# Patient Record
Sex: Female | Born: 1985
Health system: Southern US, Community
[De-identification: ages and names within clinical notes are randomized; demographics above are authoritative.]

## PROBLEM LIST (undated history)

## (undated) ENCOUNTER — Inpatient Hospital Stay (HOSPITAL_COMMUNITY): Payer: Self-pay

## (undated) DIAGNOSIS — F419 Anxiety disorder, unspecified: Secondary | ICD-10-CM

## (undated) DIAGNOSIS — J45909 Unspecified asthma, uncomplicated: Secondary | ICD-10-CM

## (undated) DIAGNOSIS — R002 Palpitations: Secondary | ICD-10-CM

## (undated) DIAGNOSIS — Z3189 Encounter for other procreative management: Secondary | ICD-10-CM

## (undated) DIAGNOSIS — D649 Anemia, unspecified: Secondary | ICD-10-CM

## (undated) HISTORY — PX: NO PAST SURGERIES: SHX2092

---

## 2011-05-15 ENCOUNTER — Emergency Department: Payer: Self-pay | Admitting: Unknown Physician Specialty

## 2011-05-15 LAB — CBC
HCT: 31.9 % — ABNORMAL LOW (ref 35.0–47.0)
HGB: 10 g/dL — ABNORMAL LOW (ref 12.0–16.0)
MCH: 21.1 pg — ABNORMAL LOW (ref 26.0–34.0)
MCHC: 31.3 g/dL — ABNORMAL LOW (ref 32.0–36.0)
MCV: 68 fL — ABNORMAL LOW (ref 80–100)
Platelet: 325 10*3/uL (ref 150–440)

## 2011-05-15 LAB — COMPREHENSIVE METABOLIC PANEL
Alkaline Phosphatase: 41 U/L — ABNORMAL LOW (ref 50–136)
Anion Gap: 12 (ref 7–16)
BUN: 10 mg/dL (ref 7–18)
Bilirubin,Total: 0.4 mg/dL (ref 0.2–1.0)
Calcium, Total: 9 mg/dL (ref 8.5–10.1)
Chloride: 108 mmol/L — ABNORMAL HIGH (ref 98–107)
Co2: 24 mmol/L (ref 21–32)
EGFR (African American): >60
EGFR (Non-African Amer.): >60
SGPT (ALT): 12 U/L
Total Protein: 7.7 g/dL (ref 6.4–8.2)

## 2011-05-15 LAB — PREGNANCY, URINE: Pregnancy Test, Urine: NEGATIVE m[IU]/mL

## 2011-05-15 LAB — URINALYSIS, COMPLETE
Bilirubin,UR: NEGATIVE
Blood: NEGATIVE
Glucose,UR: NEGATIVE mg/dL (ref 0–75)
Ketone: NEGATIVE
Specific Gravity: 1.002 (ref 1.003–1.030)
WBC UR: 1 /HPF (ref 0–5)

## 2011-07-16 ENCOUNTER — Emergency Department: Payer: Self-pay | Admitting: Emergency Medicine

## 2011-07-16 LAB — RAPID INFLUENZA A&B ANTIGENS

## 2011-07-17 ENCOUNTER — Emergency Department: Payer: Self-pay | Admitting: *Deleted

## 2011-07-19 ENCOUNTER — Emergency Department: Payer: Self-pay | Admitting: Emergency Medicine

## 2012-01-21 ENCOUNTER — Emergency Department: Payer: Self-pay | Admitting: Emergency Medicine

## 2012-01-21 LAB — BASIC METABOLIC PANEL
BUN: 10 mg/dL (ref 7–18)
Calcium, Total: 8.7 mg/dL (ref 8.5–10.1)
Chloride: 106 mmol/L (ref 98–107)
Creatinine: 0.86 mg/dL (ref 0.60–1.30)
EGFR (Non-African Amer.): >60
Glucose: 63 mg/dL — ABNORMAL LOW (ref 65–99)
Osmolality: 278 (ref 275–301)
Potassium: 3.5 mmol/L (ref 3.5–5.1)

## 2012-01-21 LAB — CBC
HCT: 35 % (ref 35.0–47.0)
HGB: 11.1 g/dL — ABNORMAL LOW (ref 12.0–16.0)
MCH: 23 pg — ABNORMAL LOW (ref 26.0–34.0)
MCHC: 31.8 g/dL — ABNORMAL LOW (ref 32.0–36.0)
MCV: 73 fL — ABNORMAL LOW (ref 80–100)
Platelet: 299 10*3/uL (ref 150–440)
RBC: 4.82 10*6/uL (ref 3.80–5.20)
RDW: 20.5 % — ABNORMAL HIGH (ref 11.5–14.5)
WBC: 9.5 10*3/uL (ref 3.6–11.0)

## 2012-01-21 LAB — MAGNESIUM: Magnesium: 1.7 mg/dL — ABNORMAL LOW

## 2012-03-10 ENCOUNTER — Emergency Department: Payer: Self-pay

## 2012-08-24 ENCOUNTER — Emergency Department: Payer: Self-pay | Admitting: Emergency Medicine

## 2012-09-28 ENCOUNTER — Emergency Department: Payer: Self-pay | Admitting: Emergency Medicine

## 2012-09-28 LAB — CBC
HCT: 27.4 % — ABNORMAL LOW (ref 35.0–47.0)
HGB: 8.6 g/dL — ABNORMAL LOW (ref 12.0–16.0)
MCH: 20.2 pg — ABNORMAL LOW (ref 26.0–34.0)
MCHC: 31.4 g/dL — ABNORMAL LOW (ref 32.0–36.0)
MCV: 64 fL — ABNORMAL LOW (ref 80–100)

## 2012-09-28 LAB — URINALYSIS, COMPLETE
Bacteria: NONE SEEN
Bilirubin,UR: NEGATIVE
Glucose,UR: NEGATIVE mg/dL (ref 0–75)
Ketone: NEGATIVE
Leukocyte Esterase: NEGATIVE
Ph: 5 (ref 4.5–8.0)
Protein: NEGATIVE
RBC,UR: <1 /HPF (ref 0–5)
Specific Gravity: 1.032 (ref 1.003–1.030)
WBC UR: <1 /HPF (ref 0–5)

## 2012-09-28 LAB — BASIC METABOLIC PANEL
Anion Gap: 4 — ABNORMAL LOW (ref 7–16)
BUN: 11 mg/dL (ref 7–18)
Calcium, Total: 8.9 mg/dL (ref 8.5–10.1)
Chloride: 107 mmol/L (ref 98–107)
Co2: 27 mmol/L (ref 21–32)
EGFR (African American): >60
Glucose: 73 mg/dL (ref 65–99)
Potassium: 3.6 mmol/L (ref 3.5–5.1)

## 2012-09-28 LAB — TROPONIN I: Troponin-I: <0.02 ng/mL

## 2012-11-02 ENCOUNTER — Emergency Department: Payer: Self-pay | Admitting: Emergency Medicine

## 2012-11-02 LAB — COMPREHENSIVE METABOLIC PANEL
Alkaline Phosphatase: 67 U/L (ref 50–136)
Anion Gap: 6 — ABNORMAL LOW (ref 7–16)
BUN: 7 mg/dL (ref 7–18)
Chloride: 108 mmol/L — ABNORMAL HIGH (ref 98–107)
Co2: 27 mmol/L (ref 21–32)
Creatinine: 0.81 mg/dL (ref 0.60–1.30)
EGFR (Non-African Amer.): >60
Glucose: 90 mg/dL (ref 65–99)
Potassium: 3.4 mmol/L — ABNORMAL LOW (ref 3.5–5.1)
SGOT(AST): 20 U/L (ref 15–37)
SGPT (ALT): 13 U/L (ref 12–78)
Total Protein: 7.9 g/dL (ref 6.4–8.2)

## 2012-11-02 LAB — CBC
HCT: 29.8 % — ABNORMAL LOW (ref 35.0–47.0)
HGB: 9.4 g/dL — ABNORMAL LOW (ref 12.0–16.0)
MCV: 64 fL — ABNORMAL LOW (ref 80–100)
Platelet: 348 10*3/uL (ref 150–440)
RBC: 4.64 10*6/uL (ref 3.80–5.20)
WBC: 9 10*3/uL (ref 3.6–11.0)

## 2012-11-02 LAB — URINALYSIS, COMPLETE
Bilirubin,UR: NEGATIVE
Hyaline Cast: 3
Leukocyte Esterase: NEGATIVE
Ph: 5 (ref 4.5–8.0)
RBC,UR: 121 /HPF (ref 0–5)
Specific Gravity: 1.028 (ref 1.003–1.030)
WBC UR: 1 /HPF (ref 0–5)

## 2012-11-02 LAB — PREGNANCY, URINE: Pregnancy Test, Urine: NEGATIVE m[IU]/mL

## 2013-01-04 ENCOUNTER — Emergency Department: Payer: Self-pay | Admitting: Emergency Medicine

## 2013-01-04 LAB — RAPID INFLUENZA A&B ANTIGENS

## 2013-03-19 ENCOUNTER — Emergency Department: Payer: Self-pay | Admitting: Emergency Medicine

## 2013-04-20 ENCOUNTER — Emergency Department: Payer: Self-pay | Admitting: Emergency Medicine

## 2013-08-14 ENCOUNTER — Emergency Department: Payer: Self-pay | Admitting: Emergency Medicine

## 2014-02-20 ENCOUNTER — Emergency Department: Payer: Self-pay | Admitting: Emergency Medicine

## 2014-04-26 ENCOUNTER — Emergency Department: Payer: Self-pay | Admitting: Emergency Medicine

## 2014-04-26 LAB — URINALYSIS, COMPLETE
Bacteria: NONE SEEN
Bilirubin,UR: NEGATIVE
Glucose,UR: NEGATIVE mg/dL (ref 0–75)
Ketone: NEGATIVE
Leukocyte Esterase: NEGATIVE
Nitrite: NEGATIVE
PH: 7 (ref 4.5–8.0)
PROTEIN: NEGATIVE
SPECIFIC GRAVITY: 1.002 (ref 1.003–1.030)
Squamous Epithelial: 1
WBC UR: 1 /HPF (ref 0–5)

## 2014-04-26 LAB — COMPREHENSIVE METABOLIC PANEL
ALBUMIN: 3.8 g/dL (ref 3.4–5.0)
AST: 14 U/L — AB (ref 15–37)
Alkaline Phosphatase: 57 U/L
Anion Gap: 6 — ABNORMAL LOW (ref 7–16)
BUN: 7 mg/dL (ref 7–18)
Bilirubin,Total: 0.2 mg/dL (ref 0.2–1.0)
Calcium, Total: 8.5 mg/dL (ref 8.5–10.1)
Chloride: 106 mmol/L (ref 98–107)
Co2: 27 mmol/L (ref 21–32)
Creatinine: 0.9 mg/dL (ref 0.60–1.30)
EGFR (African American): >60
Glucose: 119 mg/dL — ABNORMAL HIGH (ref 65–99)
OSMOLALITY: 277 (ref 275–301)
POTASSIUM: 3.4 mmol/L — AB (ref 3.5–5.1)
SGPT (ALT): 14 U/L
SODIUM: 139 mmol/L (ref 136–145)
TOTAL PROTEIN: 8.1 g/dL (ref 6.4–8.2)

## 2014-04-26 LAB — CBC WITH DIFFERENTIAL/PLATELET
Basophil #: 0.2 10*3/uL — ABNORMAL HIGH (ref 0.0–0.1)
Basophil %: 1.9 %
Eosinophil #: 0.3 10*3/uL (ref 0.0–0.7)
Eosinophil %: 3.2 %
HCT: 31.1 % — ABNORMAL LOW (ref 35.0–47.0)
HGB: 9.7 g/dL — AB (ref 12.0–16.0)
Lymphocyte #: 2.8 10*3/uL (ref 1.0–3.6)
Lymphocyte %: 29.8 %
MCH: 21.4 pg — ABNORMAL LOW (ref 26.0–34.0)
MCHC: 31.1 g/dL — AB (ref 32.0–36.0)
MCV: 69 fL — AB (ref 80–100)
MONOS PCT: 5.9 %
Monocyte #: 0.5 x10 3/mm (ref 0.2–0.9)
Neutrophil #: 5.5 10*3/uL (ref 1.4–6.5)
Neutrophil %: 59.2 %
Platelet: 412 10*3/uL (ref 150–440)
RBC: 4.51 10*6/uL (ref 3.80–5.20)
RDW: 21.3 % — ABNORMAL HIGH (ref 11.5–14.5)
WBC: 9.3 10*3/uL (ref 3.6–11.0)

## 2014-05-07 ENCOUNTER — Emergency Department: Payer: Self-pay | Admitting: Emergency Medicine

## 2014-05-07 LAB — COMPREHENSIVE METABOLIC PANEL
ANION GAP: 7 (ref 7–16)
Albumin: 3.7 g/dL (ref 3.4–5.0)
Alkaline Phosphatase: 62 U/L
BUN: 6 mg/dL — AB (ref 7–18)
Bilirubin,Total: 0.2 mg/dL (ref 0.2–1.0)
CHLORIDE: 106 mmol/L (ref 98–107)
Calcium, Total: 8.8 mg/dL (ref 8.5–10.1)
Co2: 27 mmol/L (ref 21–32)
Creatinine: 0.88 mg/dL (ref 0.60–1.30)
EGFR (Non-African Amer.): >60
GLUCOSE: 83 mg/dL (ref 65–99)
OSMOLALITY: 276 (ref 275–301)
Potassium: 3.6 mmol/L (ref 3.5–5.1)
SGOT(AST): 21 U/L (ref 15–37)
SGPT (ALT): 18 U/L
SODIUM: 140 mmol/L (ref 136–145)
TOTAL PROTEIN: 8.1 g/dL (ref 6.4–8.2)

## 2014-05-07 LAB — CBC WITH DIFFERENTIAL/PLATELET
Basophil #: 0.3 10*3/uL — ABNORMAL HIGH (ref 0.0–0.1)
Basophil %: 2.7 %
EOS PCT: 1.8 %
Eosinophil #: 0.2 10*3/uL (ref 0.0–0.7)
HCT: 32.5 % — ABNORMAL LOW (ref 35.0–47.0)
HGB: 10.2 g/dL — AB (ref 12.0–16.0)
LYMPHS ABS: 2.9 10*3/uL (ref 1.0–3.6)
Lymphocyte %: 27.9 %
MCH: 21.5 pg — ABNORMAL LOW (ref 26.0–34.0)
MCHC: 31.4 g/dL — ABNORMAL LOW (ref 32.0–36.0)
MCV: 68 fL — ABNORMAL LOW (ref 80–100)
Monocyte #: 0.6 x10 3/mm (ref 0.2–0.9)
Monocyte %: 6.1 %
Neutrophil #: 6.4 10*3/uL (ref 1.4–6.5)
Neutrophil %: 61.5 %
Platelet: 403 10*3/uL (ref 150–440)
RBC: 4.75 10*6/uL (ref 3.80–5.20)
RDW: 21 % — ABNORMAL HIGH (ref 11.5–14.5)
WBC: 10.5 10*3/uL (ref 3.6–11.0)

## 2014-05-07 LAB — TROPONIN I: Troponin-I: <0.02 ng/mL

## 2014-05-07 LAB — LIPASE, BLOOD: Lipase: 102 U/L (ref 73–393)

## 2014-05-08 LAB — URINALYSIS, COMPLETE
BACTERIA: NONE SEEN
Bilirubin,UR: NEGATIVE
Blood: NEGATIVE
GLUCOSE, UR: NEGATIVE mg/dL (ref 0–75)
Ketone: NEGATIVE
LEUKOCYTE ESTERASE: NEGATIVE
Nitrite: NEGATIVE
Ph: 6 (ref 4.5–8.0)
Protein: NEGATIVE
RBC,UR: <1 /HPF (ref 0–5)
Specific Gravity: 1.01 (ref 1.003–1.030)

## 2014-08-04 ENCOUNTER — Emergency Department
Admit: 2014-08-04 | Disposition: A | Discharge status: Home / Self Care | Payer: Self-pay | Admitting: Emergency Medicine

## 2015-02-18 IMAGING — CR DG CHEST 2V
1 series · 2 of 2 positions shown · non-contrast
Comparison: none

REASON FOR EXAM: Chest Pain
COMMENTS:

[Series 1: pa · 0.17mm/px · 2 of 2 slices shown]
[im 1/2]
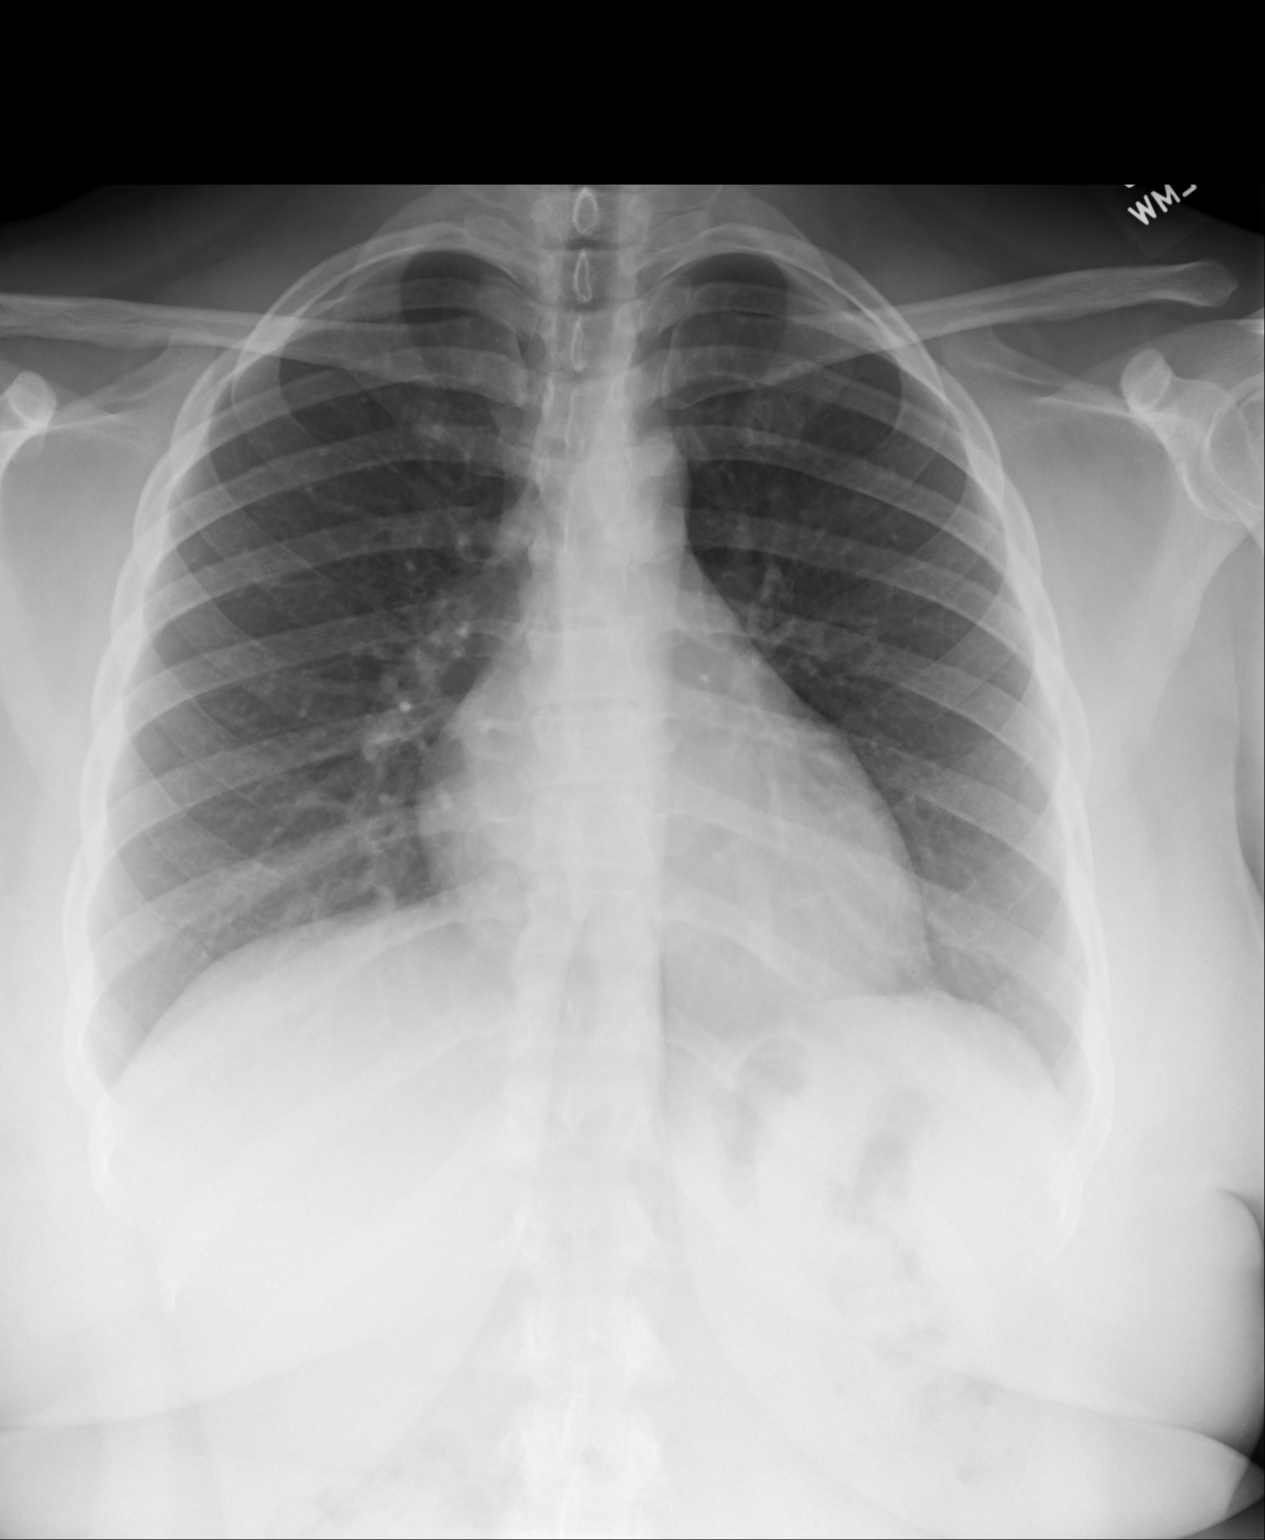
[im 2/2]
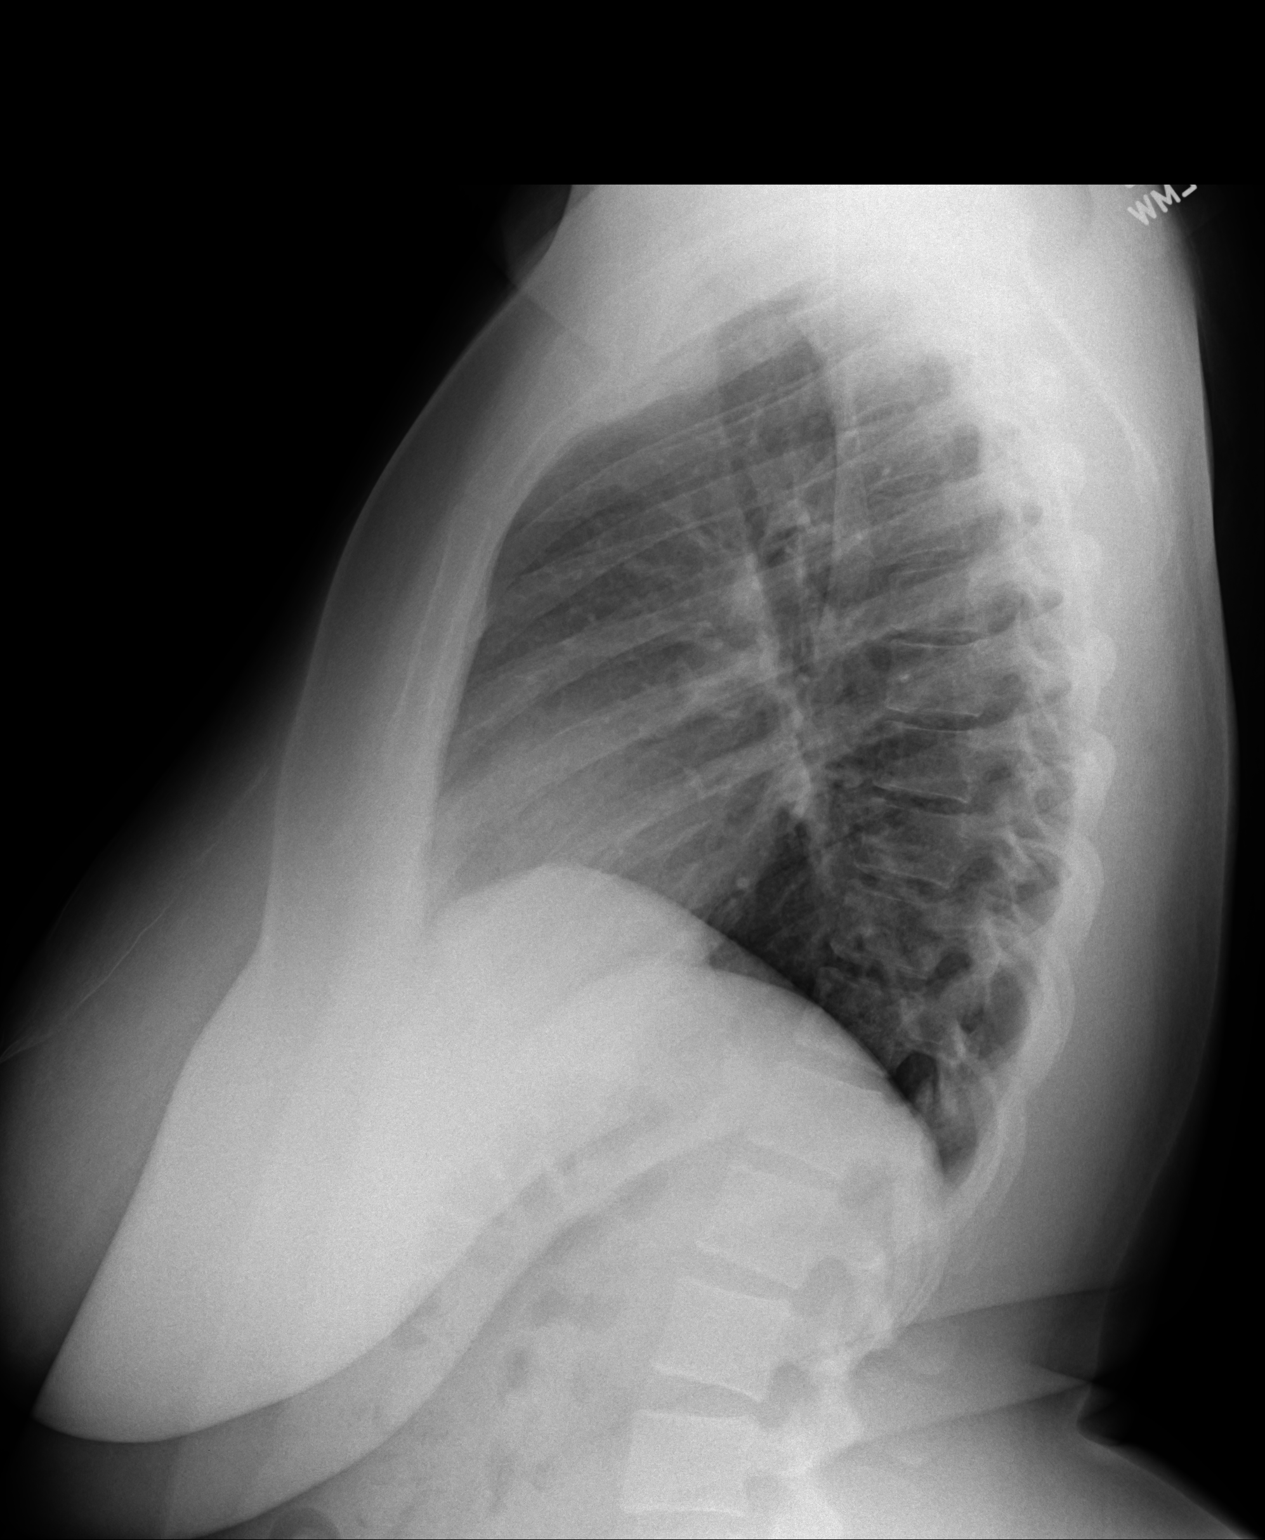

[2 of 2 positions shown; findings below may reference images not displayed]

PROCEDURE:     DXR - DXR CHEST PA (OR AP) AND LATERAL  - September 28, 2012  [DATE]

RESULT:     Comparison is made to the study January 21, 2012.

The lungs are adequately inflated. There is no focal infiltrate. The cardiac
silhouette is normal in size. The mediastinum is normal in width. There is
no pleural effusion or pneumothorax. The bony thorax exhibits no acute
abnormality.
IMPRESSION: There is no evidence of acute cardiopulmonary abnormality.

[REDACTED]

## 2015-07-04 ENCOUNTER — Encounter (HOSPITAL_COMMUNITY): Payer: Self-pay | Admitting: Emergency Medicine

## 2015-07-04 ENCOUNTER — Emergency Department (INDEPENDENT_AMBULATORY_CARE_PROVIDER_SITE_OTHER)
Admission: EM | Admit: 2015-07-04 | Discharge: 2015-07-04 | Disposition: A | Discharge status: Home / Self Care | Payer: BLUE CROSS/BLUE SHIELD | Source: Home / Self Care | Attending: Emergency Medicine | Admitting: Emergency Medicine

## 2015-07-04 DIAGNOSIS — D500 Iron deficiency anemia secondary to blood loss (chronic): Secondary | ICD-10-CM

## 2015-07-04 DIAGNOSIS — N921 Excessive and frequent menstruation with irregular cycle: Secondary | ICD-10-CM

## 2015-07-04 HISTORY — DX: Unspecified asthma, uncomplicated: J45.909

## 2015-07-04 LAB — POCT I-STAT, CHEM 8
BUN: 8 mg/dL (ref 6–20)
CREATININE: 0.8 mg/dL (ref 0.44–1.00)
Calcium, Ion: 1.23 mmol/L (ref 1.12–1.23)
Chloride: 104 mmol/L (ref 101–111)
Glucose, Bld: 91 mg/dL (ref 65–99)
HEMATOCRIT: 30 % — AB (ref 36.0–46.0)
Hemoglobin: 10.2 g/dL — ABNORMAL LOW (ref 12.0–15.0)
POTASSIUM: 3.6 mmol/L (ref 3.5–5.1)
Sodium: 140 mmol/L (ref 135–145)
TCO2: 23 mmol/L (ref 0–100)

## 2015-07-04 MED ORDER — MEDROXYPROGESTERONE ACETATE 10 MG PO TABS: 10.0000 mg | ORAL_TABLET | Freq: Every day | ORAL | Status: DC

## 2015-07-04 MED ORDER — ALBUTEROL SULFATE HFA 108 (90 BASE) MCG/ACT IN AERS: 1.0000 | INHALATION_SPRAY | Freq: Four times a day (QID) | RESPIRATORY_TRACT | Status: DC | PRN

## 2015-07-04 NOTE — Discharge Instructions (Signed)
Your shortness of breath and muscle weakness is coming from anemia. This is from your prolonged heavy period. Take Provera daily for the next 14 days. This will stop the bleeding. When you stop the medicine, you should have a normal period. Please get an iron pill over-the-counter. The most common one is ferrous sulfate 325 mg with 65 mg of elemental iron.  Take this 3 times a day with meals. This may cause upset stomach and constipation. You should see improvement in the weakness and shortness of breath over the next week. If the bleeding does not resolve or your symptoms are getting worse, please [facility] to [facility].

## 2015-07-04 NOTE — ED Notes (Signed)
[facility] with a complaint of wheezing and shortness of breath x 2 days.

## 2015-07-04 NOTE — ED Provider Notes (Signed)
CSN: [id number]     Arrival date & time 07/04/15  1855 History   First MD Initiated Contact with Patient 07/04/15 1957     Chief Complaint  Patient presents with  . Wheezing  . Shortness of Breath   (Consider location/radiation/quality/duration/timing/severity/associated sxs/prior Treatment) HPI She is a 30 year old woman here for evaluation of shortness of breath. She states over the last 2 days or so time she gets up she'll get short of breath and dizzy. She states she has been having heavy menstrual bleeding with clots for the last 4 months. She reports shortness of breath and muscle weakness with exertion. She's been having slight dizziness for a week or so, but it has gotten worse in the last several days. She is also heard some wheezing, but does have a history of asthma. She does not currently have any albuterol at home. No fevers. No nasal congestion. No cough.  She states it is not uncommon for her to have several months of bleeding, but this is a little excessive both in duration and amount.  Past Medical History  Diagnosis Date  . Asthma    History reviewed. No pertinent past surgical history. History reviewed. No pertinent family history. Social History  Substance Use Topics  . Smoking status: Never Smoker   . Smokeless tobacco: None  . Alcohol Use: No   OB History    No data available     Review of Systems As in history of present illness Allergies  Bactrim  Home Medications   Prior to Admission medications   Medication Sig Start Date End Date Taking? Authorizing Provider  albuterol (PROVENTIL HFA;VENTOLIN HFA) 108 (90 Base) MCG/ACT inhaler Inhale 1 puff into the lungs every 6 (six) hours as needed for wheezing or shortness of breath. 07/04/15   Charm RingsErin J Chaye Misch, MD  medroxyPROGESTERone (PROVERA) 10 MG tablet Take 1 tablet (10 mg total) by mouth daily. 07/04/15   Charm RingsErin J Bohdi Leeds, MD   Meds Ordered and Administered this Visit  Medications - No data to display  BP 122/74  mmHg  Pulse 85  Temp(Src) 97.8 F (36.6 C) (Oral)  Resp 16  SpO2 100%  LMP 07/04/2015 (Exact Date) Orthostatic VS for the past 24 hrs:  BP- Lying Pulse- Lying BP- Sitting Pulse- Sitting BP- Standing at 0 minutes Pulse- Standing at 0 minutes  07/04/15 2023 125/70 mmHg 83 119/71 mmHg 93 134/78 mmHg 119    Physical Exam  Constitutional: She is oriented to person, place, and time. She appears well-developed and well-nourished. No distress.  Neck: Neck supple.  Cardiovascular: Normal rate, regular rhythm and normal heart sounds.   No murmur heard. Pulmonary/Chest: Effort normal and breath sounds normal. No respiratory distress. She has no wheezes. She has no rales.  Neurological: She is alert and oriented to person, place, and time.    ED Course  Procedures (including critical care time)  Labs Review Labs Reviewed  POCT I-STAT, CHEM 8 - Abnormal; Notable for the following:    Hemoglobin 10.2 (*)    HCT 30.0 (*)    All other components within normal limits    Imaging Review No results found.    MDM   1. Iron deficiency anemia due to chronic blood loss   2. Menorrhagia with irregular cycle    Hemoglobin is slightly low at 10.2. Orthostatics minimally positive with increase in pulse rate. Suspect this is due to blood loss anemia from menorrhagia. Will treat with Provera to try and stop the bleeding. Recommended  OTC iron pills 3 times a day. I did provide a prescription for her albuterol. Return precautions reviewed.    Charm Rings, MD 07/04/15 2107

## 2015-09-09 ENCOUNTER — Encounter (HOSPITAL_COMMUNITY): Payer: Self-pay | Admitting: *Deleted

## 2015-09-09 ENCOUNTER — Emergency Department (HOSPITAL_COMMUNITY)
Admission: EM | Admit: 2015-09-09 | Discharge: 2015-09-09 | Disposition: A | Discharge status: Home / Self Care | Payer: BLUE CROSS/BLUE SHIELD | Attending: Emergency Medicine | Admitting: Emergency Medicine

## 2015-09-09 DIAGNOSIS — J45909 Unspecified asthma, uncomplicated: Secondary | ICD-10-CM | POA: Diagnosis not present

## 2015-09-09 DIAGNOSIS — Z79899 Other long term (current) drug therapy: Secondary | ICD-10-CM | POA: Insufficient documentation

## 2015-09-09 DIAGNOSIS — N939 Abnormal uterine and vaginal bleeding, unspecified: Secondary | ICD-10-CM | POA: Diagnosis present

## 2015-09-09 DIAGNOSIS — Z3202 Encounter for pregnancy test, result negative: Secondary | ICD-10-CM | POA: Diagnosis not present

## 2015-09-09 DIAGNOSIS — N72 Inflammatory disease of cervix uteri: Secondary | ICD-10-CM | POA: Diagnosis not present

## 2015-09-09 LAB — CBC WITH DIFFERENTIAL/PLATELET
BASOS ABS: 0.2 10*3/uL — AB (ref 0.0–0.1)
Basophils Relative: 1 %
EOS ABS: 0.3 10*3/uL (ref 0.0–0.7)
Eosinophils Relative: 2 %
HCT: 38 % (ref 36.0–46.0)
Hemoglobin: 12.2 g/dL (ref 12.0–15.0)
LYMPHS ABS: 2.9 10*3/uL (ref 0.7–4.0)
Lymphocytes Relative: 19 %
MCH: 22.7 pg — ABNORMAL LOW (ref 26.0–34.0)
MCHC: 32.1 g/dL (ref 30.0–36.0)
MCV: 70.8 fL — ABNORMAL LOW (ref 78.0–100.0)
MONO ABS: 0.9 10*3/uL (ref 0.1–1.0)
Monocytes Relative: 6 %
Neutro Abs: 10.7 10*3/uL — ABNORMAL HIGH (ref 1.7–7.7)
Neutrophils Relative %: 72 %
PLATELETS: 378 10*3/uL (ref 150–400)
RBC: 5.37 MIL/uL — AB (ref 3.87–5.11)
RDW: 21.2 % — AB (ref 11.5–15.5)
Smear Review: ADEQUATE
WBC: 15 10*3/uL — AB (ref 4.0–10.5)

## 2015-09-09 LAB — WET PREP, GENITAL
CLUE CELLS WET PREP: NONE SEEN
Sperm: NONE SEEN
Trich, Wet Prep: NONE SEEN
Yeast Wet Prep HPF POC: NONE SEEN

## 2015-09-09 LAB — BASIC METABOLIC PANEL
ANION GAP: 10 (ref 5–15)
BUN: <5 mg/dL — ABNORMAL LOW (ref 6–20)
CALCIUM: 9.3 mg/dL (ref 8.9–10.3)
CO2: 21 mmol/L — ABNORMAL LOW (ref 22–32)
Chloride: 106 mmol/L (ref 101–111)
Creatinine, Ser: 0.8 mg/dL (ref 0.44–1.00)
GLUCOSE: 104 mg/dL — AB (ref 65–99)
Potassium: 4 mmol/L (ref 3.5–5.1)
SODIUM: 137 mmol/L (ref 135–145)

## 2015-09-09 LAB — POC URINE PREG, ED: Preg Test, Ur: NEGATIVE

## 2015-09-09 LAB — HCG, QUANTITATIVE, PREGNANCY: HCG, BETA CHAIN, QUANT, S: 1 m[IU]/mL (ref <5)

## 2015-09-09 MED ORDER — AZITHROMYCIN 250 MG PO TABS
1000.0000 mg | ORAL_TABLET | Freq: Once | ORAL | Status: AC
  Administered 2015-09-09: 1000 mg via ORAL
  Filled 2015-09-09: qty 4

## 2015-09-09 MED ORDER — CEFTRIAXONE SODIUM 250 MG IJ SOLR
250.0000 mg | Freq: Once | INTRAMUSCULAR | Status: AC
  Administered 2015-09-09: 250 mg via INTRAMUSCULAR
  Filled 2015-09-09: qty 250

## 2015-09-09 MED ORDER — DOXYCYCLINE HYCLATE 100 MG PO CAPS: 100.0000 mg | ORAL_CAPSULE | Freq: Two times a day (BID) | ORAL | Status: DC

## 2015-09-09 NOTE — ED Provider Notes (Signed)
CSN: [id number]     Arrival date & time 09/09/15  0228 History   First MD Initiated Contact with Patient 09/09/15 0405     Chief Complaint  Patient presents with  . Anemia     (Consider location/radiation/quality/duration/timing/severity/associated sxs/prior Treatment) Patient is a 30 y.o. female presenting with vaginal bleeding. The history is provided by the patient.  Vaginal Bleeding Quality:  Spotting Severity:  Moderate Onset quality:  Gradual Duration:  2 weeks Timing:  Constant Progression:  Unchanged Chronicity:  New Menstrual history:  Irregular Possible pregnancy: no   Relieved by:  Nothing Worsened by:  Nothing tried Ineffective treatments:  None tried Associated symptoms: vaginal discharge (scant)   Associated symptoms: no abdominal pain, no back pain, no dysuria, no fever and no nausea   Risk factors: no new sexual partner     Past Medical History  Diagnosis Date  . Asthma    History reviewed. No pertinent past surgical history. No family history on file. Social History  Substance Use Topics  . Smoking status: Never Smoker   . Smokeless tobacco: None  . Alcohol Use: No   OB History    No data available     Review of Systems  Constitutional: Negative for fever.  Gastrointestinal: Negative for nausea and abdominal pain.  Genitourinary: Positive for vaginal bleeding and vaginal discharge (scant). Negative for dysuria.  Musculoskeletal: Negative for back pain.  All other systems reviewed and are negative.     Allergies  Bactrim  Home Medications   Prior to Admission medications   Medication Sig Start Date End Date Taking? Authorizing Provider  albuterol (PROVENTIL HFA;VENTOLIN HFA) 108 (90 Base) MCG/ACT inhaler Inhale 1 puff into the lungs every 6 (six) hours as needed for wheezing or shortness of breath. 07/04/15  Yes Charm RingsErin J Honig, MD  ferrous sulfate 325 (65 FE) MG tablet Take 325 mg by mouth 3 (three) times daily with meals.   Yes Historical  Provider, MD  norethindrone (AYGESTIN) 5 MG tablet Take 5 mg by mouth 3 (three) times daily.   Yes Historical Provider, MD  doxycycline (VIBRAMYCIN) 100 MG capsule Take 1 capsule (100 mg total) by mouth 2 (two) times daily. 09/09/15   Lyndal Pulleyaniel Harley Mccartney, MD   BP 129/85 mmHg  Pulse 81  Temp(Src) 98.5 F (36.9 C) (Oral)  Resp 17  Ht 5\' 3"  (1.6 m)  Wt 193 lb (87.544 kg)  BMI 34.20 kg/m2  SpO2 100%  LMP 09/09/2015 Physical Exam  Constitutional: She is oriented to person, place, and time. She appears well-developed and well-nourished. No distress.  HENT:  Head: Normocephalic.  Eyes: Conjunctivae are normal.  Neck: Neck supple. No tracheal deviation present.  Cardiovascular: Normal rate, regular rhythm and normal heart sounds.   Pulmonary/Chest: Effort normal and breath sounds normal. No respiratory distress.  Abdominal: Soft. She exhibits no distension. There is no tenderness. There is no rebound.  Genitourinary: Uterus normal. Cervix exhibits motion tenderness. Cervix exhibits no friability. Right adnexum displays no tenderness. Left adnexum displays no tenderness. No bleeding in the vagina. Vaginal discharge (yellow, foul odor) found.  Neurological: She is alert and oriented to person, place, and time.  Skin: Skin is warm and dry.  Psychiatric: She has a normal mood and affect.    ED Course  Procedures (including critical care time) Labs Review Labs Reviewed  WET PREP, GENITAL - Abnormal; Notable for the following:    WBC, Wet Prep HPF POC TOO NUMEROUS TO COUNT (*)    All other  components within normal limits  CBC WITH DIFFERENTIAL/PLATELET - Abnormal; Notable for the following:    WBC 15.0 (*)    RBC 5.37 (*)    MCV 70.8 (*)    MCH 22.7 (*)    RDW 21.2 (*)    Neutro Abs 10.7 (*)    Basophils Absolute 0.2 (*)    All other components within normal limits  BASIC METABOLIC PANEL - Abnormal; Notable for the following:    CO2 21 (*)    Glucose, Bld 104 (*)    BUN <5 (*)    All other  components within normal limits  HCG, QUANTITATIVE, PREGNANCY  POC URINE PREG, ED  SAMPLE TO BLOOD BANK  GC/CHLAMYDIA PROBE AMP ([place]) NOT AT [facility]    Imaging Review No results found. I have personally reviewed and evaluated these images and lab results as part of my medical decision-making.   EKG Interpretation None      MDM   Final diagnoses:  Cervicitis    30 y.o. female presents with vaginal discharge, spotting, and feeling short of breath this morning. Not anemic. Notable nonspecific leukocytosis. No active bleeding but discharge concerning for STI so covered empirically for PID pending swabs. Otherwise well appearing. Routine gyn follow up recommended and return precautions discussed.     Lyndal Pulley, MD 09/09/15 1102

## 2015-09-09 NOTE — ED Notes (Signed)
MD at bedside. 

## 2015-09-09 NOTE — Discharge Instructions (Signed)
Cervicitis °Cervicitis is a soreness and swelling (inflammation) of the cervix. Your cervix is located at the bottom of your uterus. It opens up to the vagina. °CAUSES  °· Sexually transmitted infections (STIs).   °· Allergic reaction.   °· Medicines or birth control devices that are put in the vagina.   °· Injury to the cervix.   °· Bacterial infections.   °RISK FACTORS °You are at greater risk if you: °· Have unprotected sexual intercourse. °· Have sexual intercourse with many partners. °· Began sexual intercourse at an early age. °· Have a history of STIs. °SYMPTOMS  °There may be no symptoms. If symptoms occur, they may include:  °· Gray, white, yellow, or bad-smelling vaginal discharge.   °· Pain or itching of the area outside the vagina.   °· Painful sexual intercourse.   °· Lower abdominal or lower back pain, especially during intercourse.   °· Frequent urination.   °· Abnormal vaginal bleeding between periods, after sexual intercourse, or after menopause.   °· Pressure or a heavy feeling in the pelvis.   °DIAGNOSIS  °Diagnosis is made after a pelvic exam. Other tests may include:  °· Examination of any discharge under a microscope (wet prep).   °· A Pap test.   °TREATMENT  °Treatment will depend on the cause of cervicitis. If it is caused by an STI, both you and your partner will need to be treated. Antibiotic medicines will be given.  °HOME CARE INSTRUCTIONS  °· Do not have sexual intercourse until your health care provider says it is okay.   °· Do not have sexual intercourse until your partner has been treated, if your cervicitis is caused by an STI.   °· Take your antibiotics as directed. Finish them even if you start to feel better.   °SEEK MEDICAL CARE IF: °· Your symptoms come back.   °· You have a fever.   °MAKE SURE YOU:  °· Understand these instructions. °· Will watch your condition. °· Will get help right away if you are not doing well or get worse. °  °This information is not intended to replace  advice given to you by your health care provider. Make sure you discuss any questions you have with your health care provider. °  °Document Released: 04/14/2005 Document Revised: 04/19/2013 Document Reviewed: 10/06/2012 °Elsevier Interactive Patient Education ©2016 [employer] ° °

## 2015-09-09 NOTE — ED Notes (Signed)
The pt woke up this am feeling sob.  She has been having prolonged periods  She has seen her ob-gyn for the same and she has bee spotting3 weeks  She has low hgb   After her period lasted 3 months just prior to this 3 week period

## 2015-09-10 ENCOUNTER — Telehealth (HOSPITAL_BASED_OUTPATIENT_CLINIC_OR_DEPARTMENT_OTHER): Payer: Self-pay | Admitting: Emergency Medicine

## 2015-09-10 ENCOUNTER — Other Ambulatory Visit: Payer: Self-pay | Admitting: Obstetrics and Gynecology

## 2015-09-10 LAB — SAMPLE TO BLOOD BANK

## 2015-09-10 LAB — GC/CHLAMYDIA PROBE AMP (~~LOC~~) NOT AT ARMC
CHLAMYDIA, DNA PROBE: NEGATIVE
Neisseria Gonorrhea: NEGATIVE

## 2015-09-23 ENCOUNTER — Emergency Department (HOSPITAL_COMMUNITY)
Admission: EM | Admit: 2015-09-23 | Discharge: 2015-09-23 | Disposition: A | Discharge status: Home / Self Care | Payer: BLUE CROSS/BLUE SHIELD | Attending: Emergency Medicine | Admitting: Emergency Medicine

## 2015-09-23 ENCOUNTER — Encounter (HOSPITAL_COMMUNITY): Payer: Self-pay | Admitting: Emergency Medicine

## 2015-09-23 DIAGNOSIS — Z792 Long term (current) use of antibiotics: Secondary | ICD-10-CM | POA: Insufficient documentation

## 2015-09-23 DIAGNOSIS — Z79899 Other long term (current) drug therapy: Secondary | ICD-10-CM | POA: Insufficient documentation

## 2015-09-23 DIAGNOSIS — J45909 Unspecified asthma, uncomplicated: Secondary | ICD-10-CM | POA: Insufficient documentation

## 2015-09-23 DIAGNOSIS — N939 Abnormal uterine and vaginal bleeding, unspecified: Secondary | ICD-10-CM | POA: Insufficient documentation

## 2015-09-23 LAB — URINE MICROSCOPIC-ADD ON: Bacteria, UA: NONE SEEN

## 2015-09-23 LAB — URINALYSIS, ROUTINE W REFLEX MICROSCOPIC
Bilirubin Urine: NEGATIVE
Glucose, UA: NEGATIVE mg/dL
KETONES UR: NEGATIVE mg/dL
NITRITE: NEGATIVE
PH: 6.5 (ref 5.0–8.0)
Protein, ur: NEGATIVE mg/dL
SPECIFIC GRAVITY, URINE: 1.007 (ref 1.005–1.030)

## 2015-09-23 LAB — CBC
HEMATOCRIT: 36.9 % (ref 36.0–46.0)
HEMOGLOBIN: 11.3 g/dL — AB (ref 12.0–15.0)
MCH: 21.9 pg — ABNORMAL LOW (ref 26.0–34.0)
MCHC: 30.6 g/dL (ref 30.0–36.0)
MCV: 71.5 fL — ABNORMAL LOW (ref 78.0–100.0)
Platelets: 426 10*3/uL — ABNORMAL HIGH (ref 150–400)
RBC: 5.16 MIL/uL — ABNORMAL HIGH (ref 3.87–5.11)
RDW: 20.9 % — ABNORMAL HIGH (ref 11.5–15.5)
WBC: 12.9 10*3/uL — AB (ref 4.0–10.5)

## 2015-09-23 NOTE — ED Notes (Signed)
Pt arrives by POV with c/o vaginal bleeding since Wednesday, but got extremely heavy in the last couple of hours. Pt was on Norethisterone for 2 months to stop bleeding that had been going on for 3 months. Pt was advised that there would be withdrawal bleeding, however, pt feels that she is bleeding excessively and is passing large clots. Pt has saturated 2 pads in 30 minutes and is currently wearing her third pad in the last half hour.

## 2015-09-23 NOTE — ED Provider Notes (Signed)
CSN: [id number]     Arrival date & time 09/23/15  0554 History   First MD Initiated Contact with Patient 09/23/15 [phone]     Chief Complaint  Patient presents with  . Vaginal Bleeding   (Consider location/radiation/quality/duration/timing/severity/associated sxs/prior Treatment) HPI 30 y.o. female, presents to the Emergency Department today complaining of diffuse vaginal bleeding after coming off of Norethisterone for 2 months. States that she was on the progesterone therapy due to profuse vaginal bleeding x 3 months. GYN has seen and evaluated since then. Last office visit was on Tuesday with Hgb 12. Pt DCed progesterone therapy this past Wednesday. Pt states that she had diffuse bleeding yesterday/today with changing of pads x2. Noted initial pelvic cramping, but has since resolved. No pain currently. Was seen on 5/14 for similar in ED. Treated with Rocephin/Azithro as well as sent home with Doxy. No fevers. No CP/SOB/ABD pain. No N/V/D.No trauma to the area. No other symptoms noted at this time.    Past Medical History  Diagnosis Date  . Asthma    History reviewed. No pertinent past surgical history. History reviewed. No pertinent family history. Social History  Substance Use Topics  . Smoking status: Never Smoker   . Smokeless tobacco: None  . Alcohol Use: No   OB History    No data available     Review of Systems ROS reviewed and all are negative for acute change except as noted in the HPI.  Allergies  Bactrim and Doxycycline  Home Medications   Prior to Admission medications   Medication Sig Start Date End Date Taking? Authorizing Provider  albuterol (PROVENTIL HFA;VENTOLIN HFA) 108 (90 Base) MCG/ACT inhaler Inhale 1 puff into the lungs every 6 (six) hours as needed for wheezing or shortness of breath. 07/04/15   Charm RingsErin J Honig, MD  doxycycline (VIBRAMYCIN) 100 MG capsule Take 1 capsule (100 mg total) by mouth 2 (two) times daily. 09/09/15   Lyndal Pulleyaniel Knott, MD  ferrous sulfate 325  (65 FE) MG tablet Take 325 mg by mouth 3 (three) times daily with meals.    Historical Provider, MD  norethindrone (AYGESTIN) 5 MG tablet Take 5 mg by mouth 3 (three) times daily.    Historical Provider, MD   BP 123/85 mmHg  Pulse 73  Temp(Src) 98.7 F (37.1 C) (Oral)  Resp 20  Ht 5\' 3"  (1.6 m)  Wt 90.266 kg  BMI 35.26 kg/m2  SpO2 100%  LMP 09/09/2015   Physical Exam  Constitutional: She is oriented to person, place, and time. She appears well-developed and well-nourished.  HENT:  Head: Normocephalic and atraumatic.  Eyes: EOM are normal. Pupils are equal, round, and reactive to light.  Neck: Normal range of motion. Neck supple. No tracheal deviation present.  Cardiovascular: Normal rate, regular rhythm, normal heart sounds and intact distal pulses.   Pulmonary/Chest: Effort normal and breath sounds normal.  Abdominal: Soft. There is no tenderness.  Musculoskeletal: Normal range of motion.  Neurological: She is alert and oriented to person, place, and time.  Skin: Skin is warm and dry.  Psychiatric: She has a normal mood and affect. Her behavior is normal. Thought content normal.  Nursing note and vitals reviewed.  ED Course  Procedures (including critical care time) Labs Review Labs Reviewed  CBC - Abnormal; Notable for the following:    WBC 12.9 (*)    RBC 5.16 (*)    Hemoglobin 11.3 (*)    MCV 71.5 (*)    MCH 21.9 (*)  RDW 20.9 (*)    Platelets 426 (*)    All other components within normal limits  URINALYSIS, ROUTINE W REFLEX MICROSCOPIC (NOT AT [facility]) - Abnormal; Notable for the following:    Hgb urine dipstick LARGE (*)    Leukocytes, UA SMALL (*)    All other components within normal limits  URINE MICROSCOPIC-ADD ON - Abnormal; Notable for the following:    Squamous Epithelial / LPF 0-5 (*)    All other components within normal limits   Imaging Review No results found. I have personally reviewed and evaluated these images and lab results as part of my  medical decision-making.   EKG Interpretation None      MDM  I have reviewed and evaluated the relevant laboratory values. I have reviewed the relevant previous healthcare records. I obtained HPI from historian. Patient discussed with supervising physician  ED Course:  Assessment: Pt is a 30yF who presents with vaginal bleeding. Recently taken off of Norethisterone for the past 2 months due to profuse vaginal bleeding x 3 months. Stopped on Wednesday. Eval by GYN on Tuesday. Pt concern due to excessive bleeding. No trauma to the area. On exam, pt in NAD. Nontoxic/nonseptic appearing. VSS. Afebrile. Lungs CTA. Heart RRR. Abdomen nontender soft. CBC with improved WBC from previous. Hgb stable. UA unremarkable. Counseled patient on withdrawal bleeding from progesterone therapy. Discussed with supervising physician who agrees with assessment and plan. Counseled patient to continue Progesterone that she was Rxed by GYN. Pt had stopped taking medication yesterday. Pt already on Iron therapy from GYN. Will DC home with follow up to GYN for further management. Given strict return precautions. At time of discharge, Patient is in no acute distress. Vital Signs are stable. Patient is able to ambulate. Patient able to tolerate PO.    Disposition/Plan:  DC Home Additional Verbal discharge instructions given and discussed with patient.  Pt Instructed to f/u with GYN in the next week for evaluation and treatment of symptoms. Return precautions given Pt acknowledges and agrees with plan  Supervising Physician Gilda Crease, MD   Final diagnoses:  Vaginal bleeding    Audry Pili, PA-C 09/23/15 [id number]  Gilda Crease, MD 09/24/15 [phone]

## 2015-09-23 NOTE — Discharge Instructions (Signed)
Please read and follow all provided instructions.  Your diagnoses today include:  1. Vaginal bleeding    Tests performed today include:  Vital signs. See below for your results today.   Medications prescribed:   Take as prescribed   Home care instructions:  Follow any educational materials contained in this packet.  Follow-up instructions: Please follow-up with your GYN for further evaluation of symptoms and treatment   Return instructions:   Please return to the Emergency Department if you do not get better, if you get worse, or new symptoms OR  - Fever (temperature greater than 101.68F)  - Bleeding that does not stop with holding pressure to the area    -Severe pain (please note that you may be more sore the day after your accident)  - Chest Pain  - Difficulty breathing  - Severe nausea or vomiting  - Inability to tolerate food and liquids  - Passing out  - Skin becoming red around your wounds  - Change in mental status (confusion or lethargy)  - New numbness or weakness     Please return if you have any other emergent concerns.  Additional Information:  Your vital signs today were: BP 122/80 mmHg   Pulse 87   Temp(Src) 98.7 F (37.1 C) (Oral)   Ht 5\' 3"  (1.6 m)   Wt 90.266 kg   BMI 35.26 kg/m2   SpO2 100%   LMP 09/09/2015 If your blood pressure (BP) was elevated above 135/85 this visit, please have this repeated by your doctor within one month. ---------------

## 2017-01-30 ENCOUNTER — Encounter (HOSPITAL_COMMUNITY): Payer: Self-pay | Admitting: *Deleted

## 2017-01-30 ENCOUNTER — Ambulatory Visit (HOSPITAL_COMMUNITY)
Admission: EM | Admit: 2017-01-30 | Discharge: 2017-01-30 | Disposition: A | Discharge status: Home / Self Care | Payer: BLUE CROSS/BLUE SHIELD | Attending: Physician Assistant | Admitting: Physician Assistant

## 2017-01-30 DIAGNOSIS — J00 Acute nasopharyngitis [common cold]: Secondary | ICD-10-CM

## 2017-01-30 MED ORDER — PHENOL 1.4 % MT LIQD: 1.0000 | OROMUCOSAL | 0 refills | Status: DC | PRN

## 2017-01-30 MED ORDER — CETIRIZINE-PSEUDOEPHEDRINE ER 5-120 MG PO TB12: 1.0000 | ORAL_TABLET | Freq: Every day | ORAL | 0 refills | Status: DC

## 2017-01-30 MED ORDER — BENZONATATE 100 MG PO CAPS: 100.0000 mg | ORAL_CAPSULE | Freq: Three times a day (TID) | ORAL | 0 refills | Status: DC

## 2017-01-30 MED ORDER — FLUTICASONE PROPIONATE 50 MCG/ACT NA SUSP: 2.0000 | Freq: Every day | NASAL | 0 refills | Status: DC

## 2017-01-30 NOTE — ED Triage Notes (Signed)
Patient reports 3 day history of productive cough, fever, nasal congestion.

## 2017-01-30 NOTE — ED Provider Notes (Signed)
[facility]    CSN: [id number] Arrival date & time: 01/30/17  1019     History   Chief Complaint Chief Complaint  Patient presents with  . URI    HPI Monica Gamble is a 31 y.o. female.   31 year old female with history of asthma comes in for 3 day history of productive cough, fever, nasal congestion. Productive cough with chest pain while coughing, that is exacerbated at night. Subjective fever with chills. This morning started with sore throat and trouble talking due to the pain. Denies ear pain, eye pain. Tried otc theraflu without relief. States that the throat irritation is what is bothering her the most. Denies shortness of breath, trouble breathing. History of asthma, and had to use inhaler last night due to some wheezing. Denies seasonal allergies. No sick contact.       Past Medical History:  Diagnosis Date  . Asthma     There are no active problems to display for this patient.   History reviewed. No pertinent surgical history.  OB History    No data available       Home Medications    Prior to Admission medications   Medication Sig Start Date End Date Taking? Authorizing Provider  albuterol (PROVENTIL HFA;VENTOLIN HFA) 108 (90 Base) MCG/ACT inhaler Inhale 1 puff into the lungs every 6 (six) hours as needed for wheezing or shortness of breath. 07/04/15  Yes Charm Rings, MD  benzonatate (TESSALON) 100 MG capsule Take 1 capsule (100 mg total) by mouth every 8 (eight) hours. 01/30/17   Cathie Hoops, Tequita Marrs V, PA-C  cetirizine-pseudoephedrine (ZYRTEC-D) 5-120 MG tablet Take 1 tablet by mouth daily. 01/30/17   Cathie Hoops, Bluma Buresh V, PA-C  ferrous sulfate 325 (65 FE) MG tablet Take 325 mg by mouth 3 (three) times daily with meals.    [provider]  fluticasone (FLONASE) 50 MCG/ACT nasal spray Place 2 sprays into both nostrils daily. 01/30/17   Cathie Hoops, Nizar Cutler V, PA-C  phenol (CHLORASEPTIC) 1.4 % LIQD Use as directed 1 spray in the mouth or throat as needed for throat  irritation / pain. 01/30/17   Belinda Fisher, PA-C    Family History History reviewed. No pertinent family history.  Social History Social History  Substance Use Topics  . Smoking status: Never Smoker  . Smokeless tobacco: Never Used  . Alcohol use No     Allergies   Bactrim [sulfamethoxazole-trimethoprim] and Doxycycline   Review of Systems Review of Systems  Reason unable to perform ROS: See HPI as above.     Physical Exam Triage Vital Signs ED Triage Vitals [01/30/17 1046]  Enc Vitals Group     BP 124/85     Pulse Rate 74     Resp 18     Temp 98.6 F (37 C)     Temp Source Oral     SpO2 100 %     Weight      Height      Head Circumference      Peak Flow      Pain Score 5     Pain Loc      Pain Edu?      Excl. in GC?    No data found.   Updated Vital Signs BP 124/85 (BP Location: Left Arm)   Pulse 74   Temp 98.6 F (37 C) (Oral)   Resp 18   SpO2 100%   Visual Acuity Right Eye Distance:   Left Eye  Distance:   Bilateral Distance:    Right Eye Near:   Left Eye Near:    Bilateral Near:     Physical Exam  Constitutional: She is oriented to person, place, and time. She appears well-developed and well-nourished. No distress.  HENT:  Head: Normocephalic and atraumatic.  Right Ear: Tympanic membrane, external ear and ear canal normal. Tympanic membrane is not erythematous and not bulging.  Left Ear: Tympanic membrane, external ear and ear canal normal. Tympanic membrane is not erythematous and not bulging.  Nose: Mucosal edema and rhinorrhea present. Right sinus exhibits no maxillary sinus tenderness and no frontal sinus tenderness. Left sinus exhibits no maxillary sinus tenderness and no frontal sinus tenderness.  Mouth/Throat: Uvula is midline and mucous membranes are normal. Posterior oropharyngeal erythema present.  Eyes: Pupils are equal, round, and reactive to light. Conjunctivae are normal.  Neck: Normal range of motion. Neck supple.    Cardiovascular: Normal rate, regular rhythm and normal heart sounds.  Exam reveals no gallop and no friction rub.   No murmur heard. Pulmonary/Chest: Effort normal and breath sounds normal. She has no decreased breath sounds. She has no wheezes. She has no rhonchi. She has no rales.  Lymphadenopathy:    She has no cervical adenopathy.  Neurological: She is alert and oriented to person, place, and time.  Skin: Skin is warm and dry.  Psychiatric: She has a normal mood and affect. Her behavior is normal. Judgment normal.     UC Treatments / Results  Labs (all labs ordered are listed, but only abnormal results are displayed) Labs Reviewed - No data to display  EKG  EKG Interpretation None       Radiology No results found.  Procedures Procedures (including critical care time)  Medications Ordered in UC Medications - No data to display   Initial Impression / Assessment and Plan / UC Course  I have reviewed the triage vital signs and the nursing notes.  Pertinent labs & imaging results that were available during my care of the patient were reviewed by me and considered in my medical decision making (see chart for details).    Discussed with patient history and exam most consistent with viral URI. Symptomatic treatment as needed. Push fluids. Return precautions given.   Final Clinical Impressions(s) / UC Diagnoses   Final diagnoses:  Acute nasopharyngitis    New Prescriptions Discharge Medication List as of 01/30/2017 11:47 AM    START taking these medications   Details  benzonatate (TESSALON) 100 MG capsule Take 1 capsule (100 mg total) by mouth every 8 (eight) hours., Starting Fri 01/30/2017, Normal    cetirizine-pseudoephedrine (ZYRTEC-D) 5-120 MG tablet Take 1 tablet by mouth daily., Starting Fri 01/30/2017, Normal    fluticasone (FLONASE) 50 MCG/ACT nasal spray Place 2 sprays into both nostrils daily., Starting Fri 01/30/2017, Normal    phenol (CHLORASEPTIC) 1.4 %  LIQD Use as directed 1 spray in the mouth or throat as needed for throat irritation / pain., Starting Fri 01/30/2017, Normal          Lucillie Kiesel V, PA-C 01/30/17 1150

## 2017-01-30 NOTE — Discharge Instructions (Signed)
Tessalon for cough. Phenol for sore throat. Continue albuterol as needed for wheezing. Start flonase, zyrtec-D for nasal congestion. You can use over the counter nasal saline rinse such as neti pot for nasal congestion. Keep hydrated, your urine should be clear to pale yellow in color. Tylenol/motrin for fever and pain. Monitor for any worsening of symptoms, chest pain, shortness of breath, wheezing, swelling of the throat, follow up for reevaluation.

## 2017-05-14 ENCOUNTER — Other Ambulatory Visit: Payer: Self-pay

## 2017-05-14 ENCOUNTER — Emergency Department (HOSPITAL_BASED_OUTPATIENT_CLINIC_OR_DEPARTMENT_OTHER): Payer: BLUE CROSS/BLUE SHIELD

## 2017-05-14 ENCOUNTER — Emergency Department (HOSPITAL_BASED_OUTPATIENT_CLINIC_OR_DEPARTMENT_OTHER)
Admission: EM | Admit: 2017-05-14 | Discharge: 2017-05-14 | Disposition: A | Discharge status: Home / Self Care | Payer: BLUE CROSS/BLUE SHIELD | Attending: Emergency Medicine | Admitting: Emergency Medicine

## 2017-05-14 ENCOUNTER — Encounter (HOSPITAL_BASED_OUTPATIENT_CLINIC_OR_DEPARTMENT_OTHER): Payer: Self-pay

## 2017-05-14 DIAGNOSIS — D649 Anemia, unspecified: Secondary | ICD-10-CM | POA: Insufficient documentation

## 2017-05-14 DIAGNOSIS — N939 Abnormal uterine and vaginal bleeding, unspecified: Secondary | ICD-10-CM | POA: Diagnosis present

## 2017-05-14 DIAGNOSIS — J45909 Unspecified asthma, uncomplicated: Secondary | ICD-10-CM | POA: Diagnosis not present

## 2017-05-14 DIAGNOSIS — R109 Unspecified abdominal pain: Secondary | ICD-10-CM | POA: Insufficient documentation

## 2017-05-14 DIAGNOSIS — Z79899 Other long term (current) drug therapy: Secondary | ICD-10-CM | POA: Insufficient documentation

## 2017-05-14 HISTORY — DX: Anemia, unspecified: D64.9

## 2017-05-14 LAB — CBC WITH DIFFERENTIAL/PLATELET
Basophils Absolute: 0.1 10*3/uL (ref 0.0–0.1)
Basophils Relative: 1 %
EOS ABS: 0.2 10*3/uL (ref 0.0–0.7)
Eosinophils Relative: 2 %
HCT: 34.3 % — ABNORMAL LOW (ref 36.0–46.0)
HEMOGLOBIN: 11.4 g/dL — AB (ref 12.0–15.0)
LYMPHS ABS: 2.1 10*3/uL (ref 0.7–4.0)
LYMPHS PCT: 21 %
MCH: 25.9 pg — AB (ref 26.0–34.0)
MCHC: 33.2 g/dL (ref 30.0–36.0)
MCV: 77.8 fL — ABNORMAL LOW (ref 78.0–100.0)
Monocytes Absolute: 0.5 10*3/uL (ref 0.1–1.0)
Monocytes Relative: 5 %
NEUTROS PCT: 71 %
Neutro Abs: 7.4 10*3/uL (ref 1.7–7.7)
Platelets: 319 10*3/uL (ref 150–400)
RBC: 4.41 MIL/uL (ref 3.87–5.11)
RDW: 14.6 % (ref 11.5–15.5)
WBC: 10.3 10*3/uL (ref 4.0–10.5)

## 2017-05-14 LAB — URINALYSIS, ROUTINE W REFLEX MICROSCOPIC
Bilirubin Urine: NEGATIVE
Glucose, UA: NEGATIVE mg/dL
Ketones, ur: NEGATIVE mg/dL
LEUKOCYTES UA: NEGATIVE
NITRITE: NEGATIVE
PH: 5.5 (ref 5.0–8.0)
Protein, ur: NEGATIVE mg/dL

## 2017-05-14 LAB — URINALYSIS, MICROSCOPIC (REFLEX)

## 2017-05-14 LAB — PREGNANCY, URINE: Preg Test, Ur: NEGATIVE

## 2017-05-14 LAB — HCG, QUANTITATIVE, PREGNANCY

## 2017-05-14 NOTE — ED Provider Notes (Signed)
Care assumed from Dr. Madilyn Hookees.    At time of transfer of care, patient is awaiting pelvic ultrasound to look for abnormalities.  Patient was found to have a negative pregnancy test and is likely having a miscarriage.  According to previous team, patient is seeing her OB/GYN tomorrow and knows she will be discharged after ultrasound to rule out torsion or other abnormality.  While awaiting ultrasound, patient told nursing that she needed to leave to go pick up a family member.  She says that she could not wait for the ultrasound.  Patient ended up leaving before I could see the patient and discussed discharge instructions however, patient had previously had discussions with the prior team about the follow-up tomorrow and return precautions.  Patient left in stable condition and will need to follow-up with OB/GYN tomorrow and discuss if she needs a pelvic ultrasound at that time.    Tegeler, Canary Brimhristopher J, MD 05/14/17 [id number]

## 2017-05-14 NOTE — ED Triage Notes (Signed)
Pt states she had pos home preg test 1-2 weeks ago-LMP 04/12/17 until she passed a clot 4 days ago then started heavy vaginal bleeding-NAD-steady gait

## 2017-05-14 NOTE — ED Provider Notes (Signed)
[facility] EMERGENCY DEPARTMENT Provider Note   CSN: [id number] Arrival date & time: 05/14/17  1125     History   Chief Complaint Chief Complaint  Patient presents with  . Vaginal Bleeding    HPI Monica Gamble is a 32 y.o. female.  The history is provided by the patient. No language interpreter was used.  Vaginal Bleeding  Primary symptoms include vaginal bleeding.   Monica Gamble is a 32 y.o. female who presents to the Emergency Department complaining of vaginal bleeding.  LMP 12/6 and it was normal.  Had a postive pregnancy test two weeks ago and took a second one three days later.  She has experienced intermittent spotting with occaasional clots.  Today she passed multiple clots.  She does have nausea with associated cramping.   Past Medical History:  Diagnosis Date  . Anemia   . Asthma     There are no active problems to display for this patient.   History reviewed. No pertinent surgical history.  OB History    No data available       Home Medications    Prior to Admission medications   Medication Sig Start Date End Date Taking? Authorizing Provider  ferrous sulfate 325 (65 FE) MG tablet Take 325 mg by mouth 3 (three) times daily with meals.    [provider]  fluticasone (FLONASE) 50 MCG/ACT nasal spray Place 2 sprays into both nostrils daily. 01/30/17   Belinda Fisher, PA-C    Family History No family history on file.  Social History Social History   Tobacco Use  . Smoking status: Never Smoker  . Smokeless tobacco: Never Used  Substance Use Topics  . Alcohol use: No  . Drug use: No     Allergies   Bactrim [sulfamethoxazole-trimethoprim] and Doxycycline   Review of Systems Review of Systems  Genitourinary: Positive for vaginal bleeding.  All other systems reviewed and are negative.    Physical Exam Updated Vital Signs BP 113/77 (BP Location: Right Arm)   Pulse 70   Temp 98.5 F (36.9 C) (Oral)   Resp 18   Ht  5\' 3"  (1.6 m)   Wt 91.6 kg (202 lb)   LMP 04/12/2017   SpO2 100%   BMI 35.78 kg/m   Physical Exam  Constitutional: She is oriented to person, place, and time. She appears well-developed and well-nourished.  HENT:  Head: Normocephalic and atraumatic.  Cardiovascular: Normal rate and regular rhythm.  No murmur heard. Pulmonary/Chest: Effort normal and breath sounds normal. No respiratory distress.  Abdominal: Soft. There is no tenderness. There is no rebound and no guarding.  Genitourinary:  Genitourinary Comments: Moderate vaginal bleeding, no clots, os closed.  Musculoskeletal: She exhibits no edema or tenderness.  Neurological: She is alert and oriented to person, place, and time.  Skin: Skin is warm and dry.  Psychiatric: She has a normal mood and affect. Her behavior is normal.  Nursing note and vitals reviewed.    ED Treatments / Results  Labs (all labs ordered are listed, but only abnormal results are displayed) Labs Reviewed  URINALYSIS, ROUTINE W REFLEX MICROSCOPIC - Abnormal; Notable for the following components:      Result Value   APPearance CLOUDY (*)    Specific Gravity, Urine >1.030 (*)    Hgb urine dipstick LARGE (*)    All other components within normal limits  URINALYSIS, MICROSCOPIC (REFLEX) - Abnormal; Notable for the following components:   Bacteria, UA MANY (*)  Squamous Epithelial / LPF 0-5 (*)    All other components within normal limits  PREGNANCY, URINE    EKG  EKG Interpretation None       Radiology No results found.  Procedures Procedures (including critical care time)  Medications Ordered in ED Medications - No data to display   Initial Impression / Assessment and Plan / ED Course  I have reviewed the triage vital signs and the nursing notes.  Pertinent labs & imaging results that were available during my care of the patient were reviewed by me and considered in my medical decision making (see chart for details).     Pt  here for evaluation of vaginal bleeding and passing clots, two positive pregnancy tests at home prior to ED arrival.  On exam she has small to moderate blood with no evidence of hemorrhage and closed os.  Patient care transferred pending pelvic ultrasound.    Final Clinical Impressions(s) / ED Diagnoses   Final diagnoses:  None    ED Discharge Orders    None       Tilden Fossaees, Kensly Bowmer, MD 05/14/17 [phone]

## 2017-05-14 NOTE — ED Notes (Signed)
Pt left because she had to pick up child

## 2017-05-15 LAB — ABO/RH: ABO/RH(D): A POS

## 2017-05-15 LAB — GC/CHLAMYDIA PROBE AMP (~~LOC~~) NOT AT ARMC
CHLAMYDIA, DNA PROBE: NEGATIVE
NEISSERIA GONORRHEA: NEGATIVE

## 2017-05-15 LAB — RPR: RPR Ser Ql: NONREACTIVE

## 2017-05-15 LAB — HIV ANTIBODY (ROUTINE TESTING W REFLEX): HIV SCREEN 4TH GENERATION: NONREACTIVE

## 2017-06-06 ENCOUNTER — Emergency Department (HOSPITAL_COMMUNITY): Payer: BLUE CROSS/BLUE SHIELD

## 2017-06-06 ENCOUNTER — Emergency Department (HOSPITAL_COMMUNITY)
Admission: EM | Admit: 2017-06-06 | Discharge: 2017-06-06 | Disposition: A | Discharge status: Home / Self Care | Payer: BLUE CROSS/BLUE SHIELD | Attending: Emergency Medicine | Admitting: Emergency Medicine

## 2017-06-06 ENCOUNTER — Other Ambulatory Visit: Payer: Self-pay

## 2017-06-06 ENCOUNTER — Encounter (HOSPITAL_COMMUNITY): Payer: Self-pay | Admitting: Emergency Medicine

## 2017-06-06 DIAGNOSIS — N939 Abnormal uterine and vaginal bleeding, unspecified: Secondary | ICD-10-CM | POA: Insufficient documentation

## 2017-06-06 DIAGNOSIS — J45909 Unspecified asthma, uncomplicated: Secondary | ICD-10-CM | POA: Diagnosis not present

## 2017-06-06 DIAGNOSIS — D62 Acute posthemorrhagic anemia: Secondary | ICD-10-CM | POA: Diagnosis not present

## 2017-06-06 DIAGNOSIS — Z79899 Other long term (current) drug therapy: Secondary | ICD-10-CM | POA: Insufficient documentation

## 2017-06-06 LAB — WET PREP, GENITAL
Clue Cells Wet Prep HPF POC: NONE SEEN
Sperm: NONE SEEN
Trich, Wet Prep: NONE SEEN
Yeast Wet Prep HPF POC: NONE SEEN

## 2017-06-06 LAB — CBC
HCT: 28.1 % — ABNORMAL LOW (ref 36.0–46.0)
Hemoglobin: 8.8 g/dL — ABNORMAL LOW (ref 12.0–15.0)
MCH: 25.5 pg — AB (ref 26.0–34.0)
MCHC: 31.3 g/dL (ref 30.0–36.0)
MCV: 81.4 fL (ref 78.0–100.0)
PLATELETS: 371 10*3/uL (ref 150–400)
RBC: 3.45 MIL/uL — AB (ref 3.87–5.11)
RDW: 15.3 % (ref 11.5–15.5)
WBC: 8.6 10*3/uL (ref 4.0–10.5)

## 2017-06-06 LAB — I-STAT BETA HCG BLOOD, ED (MC, WL, AP ONLY)

## 2017-06-06 MED ORDER — SODIUM CHLORIDE 0.9 % IV BOLUS (SEPSIS)
1000.0000 mL | Freq: Once | INTRAVENOUS | Status: AC
  Administered 2017-06-06: 1000 mL via INTRAVENOUS

## 2017-06-06 NOTE — ED Provider Notes (Signed)
[facility] Provider Note   CSN: [id number] Arrival date & time: 06/06/17  1037     History   Chief Complaint Chief Complaint  Patient presents with  . Vaginal Bleeding    HPI Monica Gamble is a 32 y.o. female.  HPI  Patient presenting with complaint of vaginal bleeding.  She states she is seeing a reproductive endocrinologist and trying to get pregnant.  She had menstrual cycle that started 4-5 weeks ago with heavy bleeding and she has not stopped bleeding since it began.  She saw her OB/GYN who gave a prescription for Provera.  She took 10 days worth of Provera but did not stop bleeding however the bleeding did slow.  After stopping the Provera the bleeding has increased for the past 6 days.  She feels some mild shortness of breath and feels her heart beating faster than normal when she is up and moving around.  She has not had any fainting.  He states she is changing a pad every 2 hours.  No fever no abdominal pain and no vomiting.  There are no other associated systemic symptoms, there are no other alleviating or modifying factors.   Past Medical History:  Diagnosis Date  . Anemia   . Asthma     There are no active problems to display for this patient.   History reviewed. No pertinent surgical history.  OB History    No data available       Home Medications    Prior to Admission medications   Medication Sig Start Date End Date Taking? Authorizing Provider  ferrous sulfate 325 (65 FE) MG tablet Take 325 mg by mouth 3 (three) times daily with meals.    [provider]  fluticasone (FLONASE) 50 MCG/ACT nasal spray Place 2 sprays into both nostrils daily. 01/30/17   Belinda FisherYu, Amy V, PA-C    Family History No family history on file.  Social History Social History   Tobacco Use  . Smoking status: Never Smoker  . Smokeless tobacco: Never Used  Substance Use Topics  . Alcohol use: No  . Drug use: No     Allergies     Bactrim [sulfamethoxazole-trimethoprim] and Doxycycline   Review of Systems Review of Systems  ROS reviewed and all otherwise negative except for mentioned in HPI   Physical Exam Updated Vital Signs BP 112/71   Pulse 83   Temp 98.7 F (37.1 C) (Oral)   Resp 14   Ht 5\' 4"  (1.626 m)   Wt 91.6 kg (202 lb)   LMP 04/30/2017   SpO2 100%   BMI 34.67 kg/m  Vitals reviewed Physical Exam  Physical Examination: General appearance - alert, well appearing, and in no distress Mental status - alert, oriented to person, place, and time Eyes - no conjunctival injection, no scleral icterus Mouth - mucous membranes moist, pharynx normal without lesions Neck - supple, no significant adenopathy Chest - clear to auscultation, no wheezes, rales or rhonchi, symmetric air entry Heart - normal rate, regular rhythm, normal S1, S2, no murmurs, rubs, clicks or gallops Abdomen - soft, nontender, nondistended, no masses or organomegaly Pelvic- no CMT, moderate amount of blood in os, no adnexal tenderness Neurological - alert, oriented, normal speech, no focal findings or movement disorder noted Extremities - peripheral pulses normal, no pedal edema, no clubbing or cyanosis Skin - normal coloration and turgor, no rashes,   ED Treatments / Results  Labs (all labs ordered are listed, but  only abnormal results are displayed) Labs Reviewed  WET PREP, GENITAL - Abnormal; Notable for the following components:      Result Value   WBC, Wet Prep HPF POC MODERATE (*)    All other components within normal limits  CBC - Abnormal; Notable for the following components:   RBC 3.45 (*)    Hemoglobin 8.8 (*)    HCT 28.1 (*)    MCH 25.5 (*)    All other components within normal limits  I-STAT BETA HCG BLOOD, ED ([facility], [facility], [facility] ONLY)  GC/CHLAMYDIA PROBE AMP () NOT AT [facility]    EKG  EKG Interpretation None       Radiology US Pelvis Transvanginal Non-ob (tv Only)  Result Date: 06/06/2017 CLINICAL  DATA:  Vaginal bleeding for 1 month EXAM: TRANSABDOMINAL AND TRANSVAGINAL ULTRASOUND OF PELVIS TECHNIQUE: Both transabdominal and transvaginal ultrasound examinations of the pelvis were performed. Transabdominal technique was performed for global imaging of the pelvis including uterus, ovaries, adnexal regions, and pelvic cul-de-sac. It was necessary to proceed with endovaginal exam following the transabdominal exam to visualize the ovaries. COMPARISON:  None FINDINGS: Uterus Measurements: 7.5 x 3.8 x 4.3 cm. No fibroids or other mass visualized. Endometrium Thickness: 12 mm in thickness.  No focal abnormality visualized. Right ovary Measurements: 3.9 x 2.2 x 2.8 cm. Normal appearance/no adnexal mass. Left ovary Measurements: 3.7 x 2.1 x 2.7 cm. Normal appearance/no adnexal mass. Other findings No abnormal free fluid. IMPRESSION: Unremarkable pelvic ultrasound. Electronically Signed   By: Charlett Nose M.D.   On: 06/06/2017 15:48   US Pelvis Complete  Result Date: 06/06/2017 CLINICAL DATA:  Vaginal bleeding for 1 month EXAM: TRANSABDOMINAL AND TRANSVAGINAL ULTRASOUND OF PELVIS TECHNIQUE: Both transabdominal and transvaginal ultrasound examinations of the pelvis were performed. Transabdominal technique was performed for global imaging of the pelvis including uterus, ovaries, adnexal regions, and pelvic cul-de-sac. It was necessary to proceed with endovaginal exam following the transabdominal exam to visualize the ovaries. COMPARISON:  None FINDINGS: Uterus Measurements: 7.5 x 3.8 x 4.3 cm. No fibroids or other mass visualized. Endometrium Thickness: 12 mm in thickness.  No focal abnormality visualized. Right ovary Measurements: 3.9 x 2.2 x 2.8 cm. Normal appearance/no adnexal mass. Left ovary Measurements: 3.7 x 2.1 x 2.7 cm. Normal appearance/no adnexal mass. Other findings No abnormal free fluid. IMPRESSION: Unremarkable pelvic ultrasound. Electronically Signed   By: Charlett Nose M.D.   On: 06/06/2017 15:48     Procedures Procedures (including critical care time)  Medications Ordered in ED Medications  sodium chloride 0.9 % bolus 1,000 mL (0 mLs Intravenous Stopped 06/06/17 1418)     Initial Impression / Assessment and Plan / ED Course  I have reviewed the triage vital signs and the nursing notes.  Pertinent labs & imaging results that were available during my care of the patient were reviewed by me and considered in my medical decision making (see chart for details).     Presenting with ongoing vaginal bleeding for several weeks.  Her hemoglobin is 8.8 which is decreased from prior but she is not tachycardic and has had no syncope.  She is to continue her iron supplementation.  Pelvic exam and pelvic ultrasound were obtained.  She has been in contact with her OB/GYN and advised to follow-up with them about her ongoing bleeding.  Discharged with strict return precautions.  Pt agreeable with plan.  Final Clinical Impressions(s) / ED Diagnoses   Final diagnoses:  Vaginal bleeding  Anemia due to acute  blood loss    ED Discharge Orders    None       Areesha Dehaven, Latanya Maudlin, MD 06/06/17 [phone]

## 2017-06-06 NOTE — ED Notes (Signed)
Pt in US at this time. US notified to bring pt to E45 when finished. Pt family and all belongings brought to E45.

## 2017-06-06 NOTE — ED Triage Notes (Signed)
Pt. Stated, Monica Gamble Atlasve been having vaginal bleeding for 6 days after taking Provera, using a pad every 2 hours.

## 2017-06-06 NOTE — Discharge Instructions (Signed)
Return to the ED with any concerns including bleeding and soaking more than one pad per hour, abdominal pain, vomiting and not able to keep down liquids, or any other alarming symptoms

## 2017-06-08 LAB — GC/CHLAMYDIA PROBE AMP (~~LOC~~) NOT AT ARMC
CHLAMYDIA, DNA PROBE: NEGATIVE
NEISSERIA GONORRHEA: NEGATIVE

## 2017-09-29 ENCOUNTER — Other Ambulatory Visit: Payer: Self-pay

## 2017-09-29 ENCOUNTER — Encounter (HOSPITAL_BASED_OUTPATIENT_CLINIC_OR_DEPARTMENT_OTHER): Payer: Self-pay | Admitting: *Deleted

## 2017-09-29 ENCOUNTER — Emergency Department (HOSPITAL_BASED_OUTPATIENT_CLINIC_OR_DEPARTMENT_OTHER)
Admission: EM | Admit: 2017-09-29 | Discharge: 2017-09-29 | Disposition: A | Discharge status: Home / Self Care | Payer: BLUE CROSS/BLUE SHIELD | Attending: Emergency Medicine | Admitting: Emergency Medicine

## 2017-09-29 DIAGNOSIS — X500XXA Overexertion from strenuous movement or load, initial encounter: Secondary | ICD-10-CM | POA: Insufficient documentation

## 2017-09-29 DIAGNOSIS — Y990 Civilian activity done for income or pay: Secondary | ICD-10-CM | POA: Insufficient documentation

## 2017-09-29 DIAGNOSIS — Y9389 Activity, other specified: Secondary | ICD-10-CM | POA: Diagnosis not present

## 2017-09-29 DIAGNOSIS — S76311A Strain of muscle, fascia and tendon of the posterior muscle group at thigh level, right thigh, initial encounter: Secondary | ICD-10-CM | POA: Insufficient documentation

## 2017-09-29 DIAGNOSIS — Y9289 Other specified places as the place of occurrence of the external cause: Secondary | ICD-10-CM | POA: Diagnosis not present

## 2017-09-29 DIAGNOSIS — S8991XA Unspecified injury of right lower leg, initial encounter: Secondary | ICD-10-CM | POA: Diagnosis present

## 2017-09-29 LAB — PREGNANCY, URINE: Preg Test, Ur: NEGATIVE

## 2017-09-29 MED ORDER — IBUPROFEN 600 MG PO TABS: 600.0000 mg | ORAL_TABLET | Freq: Four times a day (QID) | ORAL | 0 refills | Status: DC | PRN

## 2017-09-29 NOTE — ED Triage Notes (Signed)
Right hip pain after lifting something heavy today. She is ambulatory.

## 2017-09-29 NOTE — Discharge Instructions (Signed)
Please read and follow all provided instructions.  Your diagnoses today include:  1. Strain of right hamstring, initial encounter    Tests performed today include:  Vital signs. See below for your results today.   Medications prescribed:   None  Take any prescribed medications only as directed.  Home care instructions:   Follow any educational materials contained in this packet  Follow R.I.C.E. Protocol:  R - rest your injury   I  - use ice on injury without applying directly to skin  C - compress injury with bandage or splint  E - elevate the injury as much as possible  Follow-up instructions: Please follow-up with your primary care provider or the provided orthopedic physician (bone specialist) if you continue to have significant pain in 1 week. In this case you may have a more severe injury that requires further care.   Return instructions:   Please return if your fingers are numb or tingling, appear gray or blue, or you have severe pain (also elevate the arm and loosen splint or wrap if you were given one)  Please return to the Emergency Department if you experience worsening symptoms.   Please return if you have any other emergent concerns.  Additional Information:  Your vital signs today were: BP 110/72 (BP Location: Left Arm)    Pulse 90    Temp 98.2 F (36.8 C) (Oral)    Resp 18    Ht 5\' 3"  (1.6 m)    Wt 92.1 kg (203 lb)    LMP 07/30/2017    SpO2 100%    BMI 35.96 kg/m  If your blood pressure (BP) was elevated above 135/85 this visit, please have this repeated by your doctor within one month. --------------

## 2017-09-29 NOTE — ED Provider Notes (Signed)
[facility] EMERGENCY DEPARTMENT Provider Note   CSN: [id number] Arrival date & time: 09/29/17  1511     History   Chief Complaint Chief Complaint  Patient presents with  . Leg Pain    HPI Monica Gamble is a 32 y.o. female.  Patient states she was doing heavy lifting at her job today lifting 50 pounds crates.  When she lifted the last one, she felt a pop and had immediate pain in her right posterior thigh.  Pain is worse with movement and walking.  She denies any swelling or bruising or bleeding.  No other injuries.  No numbness or tingling distally. No treatments PTA.  Onset was acute.  Course is constant.     Past Medical History:  Diagnosis Date  . Anemia   . Asthma     There are no active problems to display for this patient.   History reviewed. No pertinent surgical history.   OB History   None      Home Medications    Prior to Admission medications   Medication Sig Start Date End Date Taking? Authorizing Provider  ferrous sulfate 325 (65 FE) MG tablet Take 325 mg by mouth 3 (three) times daily with meals.   Yes [provider]  fluticasone (FLONASE) 50 MCG/ACT nasal spray Place 2 sprays into both nostrils daily. 01/30/17   Cathie HoopsYu, Amy V, PA-C  ibuprofen (ADVIL,MOTRIN) 600 MG tablet Take 1 tablet (600 mg total) by mouth every 6 (six) hours as needed. 09/29/17   Renne CriglerGeiple, Ellysia Char, PA-C    Family History No family history on file.  Social History Social History   Tobacco Use  . Smoking status: Never Smoker  . Smokeless tobacco: Never Used  Substance Use Topics  . Alcohol use: No  . Drug use: No     Allergies   Bactrim [sulfamethoxazole-trimethoprim] and Doxycycline   Review of Systems Review of Systems  Constitutional: Negative for activity change.  Musculoskeletal: Positive for gait problem and myalgias. Negative for arthralgias, back pain, joint swelling and neck pain.  Skin: Negative for wound.  Neurological: Negative for  weakness and numbness.     Physical Exam Updated Vital Signs BP 110/72 (BP Location: Left Arm)   Pulse 90   Temp 98.2 F (36.8 C) (Oral)   Resp 18   Ht 5\' 3"  (1.6 m)   Wt 92.1 kg (203 lb)   LMP 07/30/2017   SpO2 100%   BMI 35.96 kg/m   Physical Exam  Constitutional: She appears well-developed and well-nourished.  HENT:  Head: Normocephalic and atraumatic.  Eyes: Pupils are equal, round, and reactive to light.  Neck: Normal range of motion. Neck supple.  Cardiovascular: Normal pulses. Exam reveals no decreased pulses.  Musculoskeletal: She exhibits tenderness. She exhibits no edema.       Right hip: Normal.       Right knee: Normal.       Right ankle: Normal.       Right upper leg: She exhibits tenderness. She exhibits no bony tenderness, no swelling and no edema.  Patient with significant pain in the R hamstring when foot is dorsiflexed.   Neurological: She is alert. No sensory deficit.  Motor, sensation, and vascular distal to the injury is fully intact.   Skin: Skin is warm and dry.  Psychiatric: She has a normal mood and affect.  Nursing note and vitals reviewed.    ED Treatments / Results  Labs (all labs ordered are listed, but  only abnormal results are displayed) Labs Reviewed  PREGNANCY, URINE    EKG None  Radiology No results found.  Procedures Procedures (including critical care time)  Medications Ordered in ED Medications - No data to display   Initial Impression / Assessment and Plan / ED Course  I have reviewed the triage vital signs and the nursing notes.  Pertinent labs & imaging results that were available during my care of the patient were reviewed by me and considered in my medical decision making (see chart for details).     Patient seen and examined.  Patient has apparent hamstring injury on the right.  Discussed rice protocol, NSAIDs.  Sports medicine follow-up given.  Patient provided with crutches and prescription for  ibuprofen.  Vital signs reviewed and are as follows: BP 110/72 (BP Location: Left Arm)   Pulse 90   Temp 98.2 F (36.8 C) (Oral)   Resp 18   Ht 5\' 3"  (1.6 m)   Wt 92.1 kg (203 lb)   LMP 07/30/2017   SpO2 100%   BMI 35.96 kg/m    Final Clinical Impressions(s) / ED Diagnoses   Final diagnoses:  Strain of right hamstring, initial encounter   + hamstring injury.  Lower extremity is neurovascularly intact.  Full range of motion of joints.   ED Discharge Orders        Ordered    ibuprofen (ADVIL,MOTRIN) 600 MG tablet  Every 6 hours PRN     09/29/17 1715       Renne Crigler, PA-C 09/29/17 1720    Arby Barrette, MD 09/30/17 0010

## 2017-10-29 DIAGNOSIS — B259 Cytomegaloviral disease, unspecified: Secondary | ICD-10-CM | POA: Diagnosis not present

## 2017-10-31 DIAGNOSIS — Z3189 Encounter for other procreative management: Secondary | ICD-10-CM

## 2017-10-31 HISTORY — DX: Encounter for other procreative management: Z31.89

## 2017-11-01 DIAGNOSIS — Z3181 Encounter for male factor infertility in female patient: Secondary | ICD-10-CM | POA: Diagnosis not present

## 2017-11-01 DIAGNOSIS — N978 Female infertility of other origin: Secondary | ICD-10-CM | POA: Diagnosis not present

## 2017-11-13 DIAGNOSIS — N912 Amenorrhea, unspecified: Secondary | ICD-10-CM | POA: Diagnosis not present

## 2017-11-15 DIAGNOSIS — Z3181 Encounter for male factor infertility in female patient: Secondary | ICD-10-CM | POA: Diagnosis not present

## 2017-11-15 DIAGNOSIS — N978 Female infertility of other origin: Secondary | ICD-10-CM | POA: Diagnosis not present

## 2017-11-23 ENCOUNTER — Emergency Department (HOSPITAL_BASED_OUTPATIENT_CLINIC_OR_DEPARTMENT_OTHER): Payer: BLUE CROSS/BLUE SHIELD

## 2017-11-23 ENCOUNTER — Encounter (HOSPITAL_BASED_OUTPATIENT_CLINIC_OR_DEPARTMENT_OTHER): Payer: Self-pay | Admitting: *Deleted

## 2017-11-23 ENCOUNTER — Emergency Department (HOSPITAL_BASED_OUTPATIENT_CLINIC_OR_DEPARTMENT_OTHER)
Admission: EM | Admit: 2017-11-23 | Discharge: 2017-11-23 | Disposition: A | Discharge status: Home / Self Care | Payer: BLUE CROSS/BLUE SHIELD | Attending: Emergency Medicine | Admitting: Emergency Medicine

## 2017-11-23 ENCOUNTER — Other Ambulatory Visit: Payer: Self-pay

## 2017-11-23 DIAGNOSIS — Z3A01 Less than 8 weeks gestation of pregnancy: Secondary | ICD-10-CM | POA: Diagnosis not present

## 2017-11-23 DIAGNOSIS — Z79899 Other long term (current) drug therapy: Secondary | ICD-10-CM | POA: Insufficient documentation

## 2017-11-23 DIAGNOSIS — R0602 Shortness of breath: Secondary | ICD-10-CM | POA: Diagnosis not present

## 2017-11-23 DIAGNOSIS — O9989 Other specified diseases and conditions complicating pregnancy, childbirth and the puerperium: Secondary | ICD-10-CM | POA: Diagnosis not present

## 2017-11-23 DIAGNOSIS — R109 Unspecified abdominal pain: Secondary | ICD-10-CM | POA: Diagnosis not present

## 2017-11-23 DIAGNOSIS — O99511 Diseases of the respiratory system complicating pregnancy, first trimester: Secondary | ICD-10-CM | POA: Insufficient documentation

## 2017-11-23 DIAGNOSIS — O26851 Spotting complicating pregnancy, first trimester: Secondary | ICD-10-CM | POA: Insufficient documentation

## 2017-11-23 DIAGNOSIS — O26891 Other specified pregnancy related conditions, first trimester: Secondary | ICD-10-CM | POA: Diagnosis not present

## 2017-11-23 DIAGNOSIS — R1031 Right lower quadrant pain: Secondary | ICD-10-CM | POA: Diagnosis not present

## 2017-11-23 DIAGNOSIS — O26859 Spotting complicating pregnancy, unspecified trimester: Secondary | ICD-10-CM

## 2017-11-23 DIAGNOSIS — J45909 Unspecified asthma, uncomplicated: Secondary | ICD-10-CM | POA: Diagnosis not present

## 2017-11-23 HISTORY — DX: Encounter for other procreative management: Z31.89

## 2017-11-23 LAB — URINALYSIS, ROUTINE W REFLEX MICROSCOPIC
BILIRUBIN URINE: NEGATIVE
Glucose, UA: NEGATIVE mg/dL
Hgb urine dipstick: NEGATIVE
Ketones, ur: NEGATIVE mg/dL
Leukocytes, UA: NEGATIVE
NITRITE: NEGATIVE
Protein, ur: NEGATIVE mg/dL
Specific Gravity, Urine: 1.025 (ref 1.005–1.030)
pH: 5.5 (ref 5.0–8.0)

## 2017-11-23 LAB — CBC WITH DIFFERENTIAL/PLATELET
BASOS ABS: 0.1 10*3/uL (ref 0.0–0.1)
Basophils Relative: 1 %
Eosinophils Absolute: 0.1 10*3/uL (ref 0.0–0.7)
Eosinophils Relative: 1 %
HCT: 34.6 % — ABNORMAL LOW (ref 36.0–46.0)
Hemoglobin: 11.5 g/dL — ABNORMAL LOW (ref 12.0–15.0)
LYMPHS PCT: 19 %
Lymphs Abs: 2 10*3/uL (ref 0.7–4.0)
MCH: 25.5 pg — ABNORMAL LOW (ref 26.0–34.0)
MCHC: 33.2 g/dL (ref 30.0–36.0)
MCV: 76.7 fL — ABNORMAL LOW (ref 78.0–100.0)
MONOS PCT: 4 %
Monocytes Absolute: 0.4 10*3/uL (ref 0.1–1.0)
NEUTROS ABS: 7.9 10*3/uL — AB (ref 1.7–7.7)
NEUTROS PCT: 75 %
PLATELETS: 346 10*3/uL (ref 150–400)
RBC: 4.51 MIL/uL (ref 3.87–5.11)
RDW: 17 % — ABNORMAL HIGH (ref 11.5–15.5)
WBC: 10.6 10*3/uL — ABNORMAL HIGH (ref 4.0–10.5)

## 2017-11-23 LAB — WET PREP, GENITAL
Sperm: NONE SEEN
Trich, Wet Prep: NONE SEEN
Yeast Wet Prep HPF POC: NONE SEEN

## 2017-11-23 LAB — COMPREHENSIVE METABOLIC PANEL
ALT: 10 U/L (ref 0–44)
ANION GAP: 8 (ref 5–15)
AST: 17 U/L (ref 15–41)
Albumin: 4 g/dL (ref 3.5–5.0)
Alkaline Phosphatase: 42 U/L (ref 38–126)
BILIRUBIN TOTAL: 0.4 mg/dL (ref 0.3–1.2)
BUN: 6 mg/dL (ref 6–20)
CHLORIDE: 104 mmol/L (ref 98–111)
CO2: 24 mmol/L (ref 22–32)
Calcium: 9.2 mg/dL (ref 8.9–10.3)
Creatinine, Ser: 0.83 mg/dL (ref 0.44–1.00)
GFR calc Af Amer: >60 mL/min (ref >60)
GFR calc non Af Amer: >60 mL/min (ref >60)
GLUCOSE: 104 mg/dL — AB (ref 70–99)
POTASSIUM: 3.2 mmol/L — AB (ref 3.5–5.1)
Sodium: 136 mmol/L (ref 135–145)
TOTAL PROTEIN: 7.9 g/dL (ref 6.5–8.1)

## 2017-11-23 LAB — TROPONIN I

## 2017-11-23 LAB — HCG, QUANTITATIVE, PREGNANCY: hCG, Beta Chain, Quant, S: 8776 m[IU]/mL — ABNORMAL HIGH (ref <5)

## 2017-11-23 LAB — LIPASE, BLOOD: LIPASE: 29 U/L (ref 11–51)

## 2017-11-23 MED ORDER — POTASSIUM CHLORIDE CRYS ER 20 MEQ PO TBCR
30.0000 meq | EXTENDED_RELEASE_TABLET | Freq: Once | ORAL | Status: AC
  Administered 2017-11-23: 30 meq via ORAL

## 2017-11-23 MED ORDER — POTASSIUM CHLORIDE CRYS ER 20 MEQ PO TBCR
EXTENDED_RELEASE_TABLET | ORAL | Status: AC
  Filled 2017-11-23: qty 2

## 2017-11-23 MED ORDER — ACETAMINOPHEN 325 MG PO TABS
650.0000 mg | ORAL_TABLET | Freq: Once | ORAL | Status: AC
  Administered 2017-11-23: 650 mg via ORAL
  Filled 2017-11-23: qty 2

## 2017-11-23 NOTE — ED Triage Notes (Signed)
Abdominal pain since yesterday. She is [redacted] weeks pregnant. States she has SOB on exertion.

## 2017-11-23 NOTE — Discharge Instructions (Addendum)
You were seen here today for shortness of breath and abdominal pain Your work up was reassuring.  Your Hcg was 8,776. This is up from 225 on the 21st. You will need to follow up in 48 hours to have a repeat test to insure this is elevating as it should.  Your Ultrasound showed a: Single intrauterine gestational sac and yolk sac but no embryo. You will need a follow-up ultrasound in 10-14 days could be obtained to confirm viability.   Your ekg, and troponin were reassuring. If you develop any chest pain or worsening shortness of breath please return to the emergency department.   Take 650mg  of Tylenol every 6 hours as needed for pain.   If you look worsening abdominal pain, vaginal bleeding, or pass out or develop any other new concerning symptoms please return to the emergency department

## 2017-11-23 NOTE — ED Provider Notes (Signed)
[facility] EMERGENCY DEPARTMENT Provider Note   CSN: [id number] Arrival date & time: 11/23/17  1225     History   Chief Complaint Chief Complaint  Patient presents with  . Abdominal Pain  . Shortness of Breath    HPI Monica Gamble is a 32 y.o. female who is currently [redacted] weeks pregnant by IUI with medical history of asthma who presents emergency department today for abdominal pain and shortness of breath.  Patient reports that she follows with an OB/GYN in [place].  She reports she is currently [redacted] weeks pregnant. This is her first pregnancy. She denies prior history of ectopic or abortion. She reports that 2-3 days ago she started having some spotting. She denies gross vaginal bleeding or passage of clots. This stopped yesterday. She reports yesterday evening she started having sharp, right lower abdominal pain that has been constant since onset and rates as an 8/10. She reports that nothing makes her symptoms better or worse. She has not tried anything for her symptoms. She does report some increased urinary frequency but denies any dysuria, flank pain, suprapubic pain, or hematuria.The patient also reports that since onset of her symptoms she has had some mild sob with exertion. This is relieved with rest. She denies any associated N/V, diaphoresis, chest pain, cough, lower extremity swelling, hemoptysis, or cough. She has not tried anything for this.  She reports discussing this with her OB nurse who recommended she come for evaluation. No prior abdominal surgeries. She is still passing gas. Normal BM this morning. No current vaginal bleeding.   HPI  Past Medical History:  Diagnosis Date  . Anemia   . Asthma     There are no active problems to display for this patient.   History reviewed. No pertinent surgical history.   OB History    Gravida  1   Para      Term      Preterm      AB      Living        SAB      TAB      Ectopic      Multiple      Live Births               Home Medications    Prior to Admission medications   Medication Sig Start Date End Date Taking? Authorizing Provider  Prenatal Multivit-Min-Fe-FA (PRENATAL VITAMINS PO) Take by mouth.   Yes [provider]  ferrous sulfate 325 (65 FE) MG tablet Take 325 mg by mouth 3 (three) times daily with meals.    [provider]  fluticasone (FLONASE) 50 MCG/ACT nasal spray Place 2 sprays into both nostrils daily. 01/30/17   Cathie Hoops, Amy V, PA-C  ibuprofen (ADVIL,MOTRIN) 600 MG tablet Take 1 tablet (600 mg total) by mouth every 6 (six) hours as needed. 09/29/17   Renne Crigler, PA-C    Family History No family history on file.  Social History Social History   Tobacco Use  . Smoking status: Never Smoker  . Smokeless tobacco: Never Used  Substance Use Topics  . Alcohol use: No  . Drug use: No     Allergies   Bactrim [sulfamethoxazole-trimethoprim] and Doxycycline   Review of Systems Review of Systems  All other systems reviewed and are negative.    Physical Exam Updated Vital Signs BP 110/62   Pulse 89   Temp 98.5 F (36.9 C) (Oral)   Resp 20   Ht  5\' 4"  (1.626 m)   Wt 89.8 kg (198 lb)   LMP 10/19/2017   SpO2 100%   BMI 33.99 kg/m   Physical Exam  Constitutional: She appears well-developed and well-nourished.  HENT:  Head: Normocephalic and atraumatic.  Right Ear: External ear normal.  Left Ear: External ear normal.  Nose: Nose normal.  Mouth/Throat: Uvula is midline, oropharynx is clear and moist and mucous membranes are normal. No tonsillar exudate.  Eyes: Pupils are equal, round, and reactive to light. Right eye exhibits no discharge. Left eye exhibits no discharge. No scleral icterus.  Neck: Trachea normal. Neck supple. No JVD present. No spinous process tenderness present. No neck rigidity. Normal range of motion present.  Cardiovascular: Normal rate, regular rhythm and intact distal pulses.  No murmur  heard. Pulses:      Radial pulses are 2+ on the right side, and 2+ on the left side.       Dorsalis pedis pulses are 2+ on the right side, and 2+ on the left side.       Posterior tibial pulses are 2+ on the right side, and 2+ on the left side.  No lower extremity swelling or edema. Calves symmetric in size bilaterally.  Pulmonary/Chest: Effort normal and breath sounds normal. She exhibits no tenderness.  Patient satting at 100% on room air. No increased work of breathing. No accessory muscle use. Patient is sitting upright, speaking in full sentences without difficulty   Abdominal: Soft. Bowel sounds are normal. She exhibits no distension. There is tenderness in the right lower quadrant. There is no rigidity, no rebound, no guarding, no CVA tenderness, no tenderness at McBurney's point and negative Murphy's sign.    Genitourinary:  Genitourinary Comments: Exam performed by Jacinto HalimMichael M Nialah Saravia, exam chaperoned Pelvic exam: normal external genitalia without evidence of trauma. VULVA: normal appearing vulva with no masses, tenderness or lesion. VAGINA: normal appearing vagina with normal color and discharge, no lesions. CERVIX: normal appearing cervix without lesions, cervical motion tenderness absent, cervical os closed with out purulent discharge; vaginal discharge - white and scant, Wet prep and DNA probe for chlamydia and GC obtained.   ADNEXA: normal adnexa in size and no masses. Right adnexa with tenderness. No left adnexal tenderness.  UTERUS: uterus is normal size, shape, consistency and nontender.   Musculoskeletal: She exhibits no edema.  Lymphadenopathy:    She has no cervical adenopathy.  Neurological: She is alert.  Skin: Skin is warm and dry. No rash noted. She is not diaphoretic.  Psychiatric: She has a normal mood and affect.  Nursing note and vitals reviewed.    ED Treatments / Results  Labs (all labs ordered are listed, but only abnormal results are displayed) Labs  Reviewed  WET PREP, GENITAL - Abnormal; Notable for the following components:      Result Value   Clue Cells Wet Prep HPF POC PRESENT (*)    WBC, Wet Prep HPF POC MANY (*)    All other components within normal limits  COMPREHENSIVE METABOLIC PANEL - Abnormal; Notable for the following components:   Potassium 3.2 (*)    Glucose, Bld 104 (*)    All other components within normal limits  CBC WITH DIFFERENTIAL/PLATELET - Abnormal; Notable for the following components:   WBC 10.6 (*)    Hemoglobin 11.5 (*)    HCT 34.6 (*)    MCV 76.7 (*)    MCH 25.5 (*)    RDW 17.0 (*)    Neutro Abs 7.9 (*)  All other components within normal limits  HCG, QUANTITATIVE, PREGNANCY - Abnormal; Notable for the following components:   hCG, Beta Chain, Quant, S 8,776 (*)    All other components within normal limits  LIPASE, BLOOD  URINALYSIS, ROUTINE W REFLEX MICROSCOPIC  TROPONIN I  RPR  HIV ANTIBODY (ROUTINE TESTING)  ABO/RH  GC/CHLAMYDIA PROBE AMP () NOT AT [facility]    EKG EKG Interpretation  Date/Time:  Monday November 23 2017 13:03:59 EDT Ventricular Rate:  96 PR Interval:    QRS Duration: 85 QT Interval:  344 QTC Calculation: 435 R Axis:   29 Text Interpretation:  Sinus rhythm Borderline T wave abnormalities Baseline wander in lead(s) V2 Confirmed by Jacalyn Lefevre [phone]) on 11/23/2017 1:07:26 PM   Radiology US Ob Comp < 14 Wks  Result Date: 11/23/2017 CLINICAL DATA:  Right-sided abdominal pain, quantitative hCG 8,776 EXAM: OBSTETRIC <14 WK [place] AND TRANSVAGINAL OB US TECHNIQUE: Both transabdominal and transvaginal ultrasound examinations were performed for complete evaluation of the gestation as well as the maternal uterus, adnexal regions, and pelvic cul-de-sac. Transvaginal technique was performed to assess early pregnancy. COMPARISON:  None. FINDINGS: Intrauterine gestational sac: Single intrauterine gestational sac Yolk sac:  Visible Embryo:  Not visible MSD: 8.7 mm   5 w   5 d  Subchorionic hemorrhage:  None visualized. Maternal uterus/adnexae: Right ovary measures 3.2 by 5.4 x 2.8 cm and contains probable hemorrhagic corpus luteal body measuring 2.9 cm. The left ovary measures 3.6 by 4.9 x 2.3 cm. Area of slight increased echogenicity measuring 2.5 cm may reflect collapsing or hemorrhagic follicle. Trace free fluid in the pelvis. IMPRESSION: Single intrauterine gestational sac and yolk sac but no embryo. Follow-up ultrasound in 10-14 days could be obtained to confirm viability. Trace amount of free fluid in the pelvis. Electronically Signed   By: Jasmine Pang M.D.   On: 11/23/2017 14:57   US Ob Transvaginal  Result Date: 11/23/2017 CLINICAL DATA:  Right-sided abdominal pain, quantitative hCG 8,776 EXAM: OBSTETRIC <14 WK [place] AND TRANSVAGINAL OB US TECHNIQUE: Both transabdominal and transvaginal ultrasound examinations were performed for complete evaluation of the gestation as well as the maternal uterus, adnexal regions, and pelvic cul-de-sac. Transvaginal technique was performed to assess early pregnancy. COMPARISON:  None. FINDINGS: Intrauterine gestational sac: Single intrauterine gestational sac Yolk sac:  Visible Embryo:  Not visible MSD: 8.7 mm   5 w   5 d Subchorionic hemorrhage:  None visualized. Maternal uterus/adnexae: Right ovary measures 3.2 by 5.4 x 2.8 cm and contains probable hemorrhagic corpus luteal body measuring 2.9 cm. The left ovary measures 3.6 by 4.9 x 2.3 cm. Area of slight increased echogenicity measuring 2.5 cm may reflect collapsing or hemorrhagic follicle. Trace free fluid in the pelvis. IMPRESSION: Single intrauterine gestational sac and yolk sac but no embryo. Follow-up ultrasound in 10-14 days could be obtained to confirm viability. Trace amount of free fluid in the pelvis. Electronically Signed   By: Jasmine Pang M.D.   On: 11/23/2017 14:57    Procedures Procedures (including critical care time)  Medications Ordered in ED Medications   acetaminophen (TYLENOL) tablet 650 mg (650 mg Oral Given 11/23/17 1323)     Initial Impression / Assessment and Plan / ED Course  I have reviewed the triage vital signs and the nursing notes.  Pertinent labs & imaging results that were available during my care of the patient were reviewed by me and considered in my medical decision making (see chart for details).  32 y.o. female who is currently [redacted] weeks pregnant by IUI presents today for right lower quadrant abdominal pain with some spotting in the days prior. Patient denies fever, migration of the pain, n/v/d. Does have some urinary symptoms reported. She notes some exertional sob without any chest pain, n/v, diaphoresis, cough, LEE, cough or hemoptysis.   Patient is without any fever, tachycardia, tachypnea, hypoxia or hypotension.  Heart is regular rate and rhythm, lungs are clear to auscultation bilaterally, no JVD or lower extreme swelling.  Will evaluate shortness of breath with EKG, troponin and basic labs.  I have low suspicion for pneumonia given patient is afebrile with clear lung sounds.  Will hold off on chest x-ray at this time.  Abdominal exam is with some right lower quadrant tenderness palpation.  She has a negative Murphy sign.  No McBurney's point tenderness.  Pelvic exam without any cervical motion tenderness.  Cervical os is closed without any blood in the vaginal vault.  Patient does have some right adnexal tenderness.  Will evaluate with labs and ultrasound. Tylenol given for pain.  Patient with mild anemia of 11.5.  This is up from patient's prior of 8.8 on 2/9.  Patient does have a mild leukocytosis of 10.6.  Lipase within normal limits. There is mild hypokalemia that was replaced orally.  No acute kidney injury.  LFTs within normal limits.  No anion gap acidosis.  No evidence of yeast infection or trichomonas on wet prep.  UA without evidence of UTI.  hCG is noted to be 8776.  This is up from patient's prior of 225 on  7/21.  Patient's EKG with NSR.  No evidence of STEMI.  Troponin within normal limits.  Low suspicion for ACS at this time.  Ultrasound shows a single intrauterine gestational sac and yolk sac but no embryo.  Repeat abdominal exam after Tylenol without any abdominal tenderness.  No right lower quadrant abdominal tenderness or McBurney's point tenderness.  Patient does have a mild leukocytosis but is afebrile in the department.  Given reassuring abdominal exam do not feel she needs further evaluation for appendicitis.  Patient states she had a normal bowel movement this morning after the onset of pain is still passing gas.  She is without emesis in the department.  I have low suspicion for obstruction.  Patient's vital signs do not meet criteria for Sirs or sepsis criteria.  She is without right upper quadrant abdominal pain that make me concern for cholecystitis.  No cervical motion tenderness may be concern for PID.  Given patient has remained hemodynamically stable in the department is without any abdominal pain or vaginal bleeding feel she is stable for outpatient follow-up with her OB/GYN in 2 days for repeat hCG and exam.  Recommended that she will need follow-up in 10-14 days for repeat ultrasound testing.  Discussed with patient if she is developed fever, vomiting, increasing pain in the abdomen  return to the emergency department.  She is to develop any vaginal bleeding she is also to return. She is to use tylenol for pain and is to avoid nsaids.   The evaluation does not show pathology that would require ongoing emergent intervention or inpatient treatment. I advised the patient to follow-up with OBGYN in 2 days. I advised the patient to return to the emergency department with new or worsening symptoms or new concerns. Specific return precautions discussed. The patient verbalized understanding and agreement with plan. All questions answered. No further questions at this time. The patient  is  hemodynamically stable, mentating appropriately and appears safe for discharge.  Final Clinical Impressions(s) / ED Diagnoses   Final diagnoses:  Right lower quadrant abdominal pain  Less than [redacted] weeks gestation of pregnancy  Spotting during pregnancy    ED Discharge Orders    None       Princella Pellegrini 11/23/17 1612    Jacalyn Lefevre, MD 11/26/17 [phone]

## 2017-11-24 LAB — RPR: RPR Ser Ql: NONREACTIVE

## 2017-11-24 LAB — ABO/RH: ABO/RH(D): A POS

## 2017-11-24 LAB — HIV ANTIBODY (ROUTINE TESTING W REFLEX): HIV Screen 4th Generation wRfx: NONREACTIVE

## 2017-11-25 LAB — GC/CHLAMYDIA PROBE AMP (~~LOC~~) NOT AT ARMC
Chlamydia: NEGATIVE
Neisseria Gonorrhea: NEGATIVE

## 2017-12-03 DIAGNOSIS — N912 Amenorrhea, unspecified: Secondary | ICD-10-CM | POA: Diagnosis not present

## 2017-12-04 ENCOUNTER — Other Ambulatory Visit: Payer: Self-pay

## 2017-12-04 ENCOUNTER — Emergency Department (HOSPITAL_COMMUNITY)
Admission: EM | Admit: 2017-12-04 | Discharge: 2017-12-04 | Disposition: A | Discharge status: Home / Self Care | Payer: BLUE CROSS/BLUE SHIELD | Attending: Emergency Medicine | Admitting: Emergency Medicine

## 2017-12-04 ENCOUNTER — Encounter (HOSPITAL_COMMUNITY): Payer: Self-pay | Admitting: Emergency Medicine

## 2017-12-04 DIAGNOSIS — J45909 Unspecified asthma, uncomplicated: Secondary | ICD-10-CM | POA: Diagnosis not present

## 2017-12-04 DIAGNOSIS — O209 Hemorrhage in early pregnancy, unspecified: Secondary | ICD-10-CM | POA: Diagnosis not present

## 2017-12-04 DIAGNOSIS — Z3A01 Less than 8 weeks gestation of pregnancy: Secondary | ICD-10-CM | POA: Insufficient documentation

## 2017-12-04 DIAGNOSIS — O4690 Antepartum hemorrhage, unspecified, unspecified trimester: Secondary | ICD-10-CM

## 2017-12-04 DIAGNOSIS — R109 Unspecified abdominal pain: Secondary | ICD-10-CM | POA: Diagnosis not present

## 2017-12-04 DIAGNOSIS — Z79899 Other long term (current) drug therapy: Secondary | ICD-10-CM | POA: Diagnosis not present

## 2017-12-04 DIAGNOSIS — O9989 Other specified diseases and conditions complicating pregnancy, childbirth and the puerperium: Secondary | ICD-10-CM | POA: Diagnosis not present

## 2017-12-04 DIAGNOSIS — O208 Other hemorrhage in early pregnancy: Secondary | ICD-10-CM | POA: Diagnosis not present

## 2017-12-04 DIAGNOSIS — O26899 Other specified pregnancy related conditions, unspecified trimester: Secondary | ICD-10-CM

## 2017-12-04 DIAGNOSIS — D649 Anemia, unspecified: Secondary | ICD-10-CM | POA: Insufficient documentation

## 2017-12-04 DIAGNOSIS — O2691 Pregnancy related conditions, unspecified, first trimester: Secondary | ICD-10-CM | POA: Diagnosis not present

## 2017-12-04 DIAGNOSIS — O26891 Other specified pregnancy related conditions, first trimester: Secondary | ICD-10-CM | POA: Diagnosis not present

## 2017-12-04 DIAGNOSIS — R110 Nausea: Secondary | ICD-10-CM

## 2017-12-04 LAB — URINALYSIS, ROUTINE W REFLEX MICROSCOPIC
BILIRUBIN URINE: NEGATIVE
Glucose, UA: NEGATIVE mg/dL
HGB URINE DIPSTICK: NEGATIVE
KETONES UR: NEGATIVE mg/dL
Leukocytes, UA: NEGATIVE
Nitrite: NEGATIVE
Protein, ur: NEGATIVE mg/dL
SPECIFIC GRAVITY, URINE: 1.009 (ref 1.005–1.030)
pH: 6 (ref 5.0–8.0)

## 2017-12-04 LAB — LIPASE, BLOOD: LIPASE: 31 U/L (ref 11–51)

## 2017-12-04 LAB — COMPREHENSIVE METABOLIC PANEL
ALT: 16 U/L (ref 0–44)
AST: 17 U/L (ref 15–41)
Albumin: 3.8 g/dL (ref 3.5–5.0)
Alkaline Phosphatase: 40 U/L (ref 38–126)
Anion gap: 8 (ref 5–15)
BILIRUBIN TOTAL: 0.4 mg/dL (ref 0.3–1.2)
BUN: 5 mg/dL — ABNORMAL LOW (ref 6–20)
CALCIUM: 9.6 mg/dL (ref 8.9–10.3)
CO2: 23 mmol/L (ref 22–32)
CREATININE: 0.83 mg/dL (ref 0.44–1.00)
Chloride: 105 mmol/L (ref 98–111)
Glucose, Bld: 86 mg/dL (ref 70–99)
Potassium: 4 mmol/L (ref 3.5–5.1)
Sodium: 136 mmol/L (ref 135–145)
TOTAL PROTEIN: 7 g/dL (ref 6.5–8.1)

## 2017-12-04 LAB — CBC WITH DIFFERENTIAL/PLATELET
Abs Immature Granulocytes: 0 10*3/uL (ref 0.0–0.1)
BASOS PCT: 1 %
Basophils Absolute: 0.1 10*3/uL (ref 0.0–0.1)
EOS ABS: 0.1 10*3/uL (ref 0.0–0.7)
EOS PCT: 1 %
HCT: 36.8 % (ref 36.0–46.0)
Hemoglobin: 11.5 g/dL — ABNORMAL LOW (ref 12.0–15.0)
IMMATURE GRANULOCYTES: 0 %
LYMPHS ABS: 2.1 10*3/uL (ref 0.7–4.0)
Lymphocytes Relative: 18 %
MCH: 25 pg — ABNORMAL LOW (ref 26.0–34.0)
MCHC: 31.3 g/dL (ref 30.0–36.0)
MCV: 80 fL (ref 78.0–100.0)
MONOS PCT: 6 %
Monocytes Absolute: 0.7 10*3/uL (ref 0.1–1.0)
Neutro Abs: 8.8 10*3/uL — ABNORMAL HIGH (ref 1.7–7.7)
Neutrophils Relative %: 74 %
PLATELETS: 317 10*3/uL (ref 150–400)
RBC: 4.6 MIL/uL (ref 3.87–5.11)
RDW: 15.7 % — AB (ref 11.5–15.5)
WBC: 11.9 10*3/uL — AB (ref 4.0–10.5)

## 2017-12-04 LAB — HCG, QUANTITATIVE, PREGNANCY: hCG, Beta Chain, Quant, S: 119397 m[IU]/mL — ABNORMAL HIGH (ref <5)

## 2017-12-04 LAB — I-STAT BETA HCG BLOOD, ED (MC, WL, AP ONLY): I-stat hCG, quantitative: >2000 m[IU]/mL — ABNORMAL HIGH (ref <5)

## 2017-12-04 MED ORDER — DOXYLAMINE-PYRIDOXINE 10-10 MG PO TBEC: DELAYED_RELEASE_TABLET | ORAL | 0 refills | Status: DC

## 2017-12-04 MED ORDER — ACETAMINOPHEN 325 MG PO TABS
650.0000 mg | ORAL_TABLET | Freq: Once | ORAL | Status: AC
  Administered 2017-12-04: 650 mg via ORAL
  Filled 2017-12-04: qty 2

## 2017-12-04 NOTE — ED Triage Notes (Signed)
Pt [facility], had ultrasound, and is [redacted] weeks pregnant. Today noticed pinkish spotting. Mild cramping, nausea as usual so far with the pregnancy.

## 2017-12-04 NOTE — Discharge Instructions (Signed)
Bleeding during the first 20 weeks of pregnancy is common. Mild abdominal cramping or pain can also be common in early pregnancy, due to the stretching of the ligaments as the uterus grows. Allow your pelvis to rest, avoid inserting anything into your vagina until your symptoms resolve. Stay well hydrated, get plenty of rest. Continue  taking prenatal vitamins. You can use tylenol safely in pregnancy, use as needed for pain, but avoid NSAIDs like ibuprofen or aleve/motrin/etc. Use Diclegis as directed as needed for nausea (or you can use Vitamin B6+Unisom, which are the components of Diclegis). See the list of foods below that help with nausea in pregnancy, which is a completely normal symptom. Follow the instructions below regarding your diagnosis today. Follow up with your OBGYN in 2-3 days for recheck of symptoms and ongoing management of your pregnancy.  [facility] (similar to their version of an emergency room) for changes/worsening symptoms as outlined below.    HOME CARE INSTRUCTIONS DO NOT USE TAMPONS. Do not douche, have sexual intercourse or orgasms until approved by your caregiver.  Call your OBGYN today or tomorrow to schedule a re-evaluation of your pregnancy and repeat blood betaHCG test in 2 days.   SEEK [facility] ATT[facility]ION [facility] IF: You have severe cramps in your stomach, back, or abdomen.  You have a sudden onset of severe pain in the lower part of your abdomen.  You run an unexplained temperature of 101 F (38.3 C) or higher.  You pass large clots or tissue. Save any tissue for your caregiver to inspect.  Your bleeding increases or you become light-headed, weak, or have fainting episodes.

## 2017-12-04 NOTE — ED Provider Notes (Signed)
Patient placed in Quick Look pathway, seen and evaluated   Chief Complaint: vaginal spotting  HPI: 32 year old female is currently 6 weeks and 5 days pregnant by IUI who presents for vaginal spotting.  Patient is followed by OB/GYN in [place].  She had a ultrasound done yesterday that showed a viable SIUP at 6 weeks and 5 days.  She reports that today she had some lower abdominal cramping as well as vaginal spotting.  No gross vaginal bleeding or passage of blood clots.  She reports her abdominal pain/cramping is subsided.  She notes some nausea without any emesis.  No fever at home.  She denies any urinary symptoms.  She has not taken anything for symptoms.  She denies history of ectopic, abortion or prior pregnancies.  ROS:  Positive ROS: (+) Abdominal cramping, vaginal spotting Negative ROS: (-) Fever, flank pain, urinary symptoms, emesis  Physical Exam:   Gen: No distress  Neuro: Awake and Alert  Skin: Warm    Focused Exam: Heart RRR, nml S1,S2, no m/r/g; Lungs CTAB; Abd soft, NT, no rebound or guarding; Ext 2+ pedal pulses bilaterally, no edema.  BP 113/70 (BP Location: Right Arm)   Pulse 81   Temp 99.4 F (37.4 C) (Oral)   Resp 18   Ht 5\' 4"  (1.626 m)   Wt 89.8 kg   LMP 10/19/2017 (Exact Date) Comment: Trigger shot 10/29/17, IUI performed- 10/31/17- [facility]  SpO2 99%   BMI 33.99 kg/m   Plan:  Based on initial evaluation, labs are indicated and radiology studies are not indicated.  Patient counseled on process, plan, and necessity for staying for completing the evaluation."  Initiation of care has begun. The patient has been counseled on the process, plan, and necessity for staying for the completion/evaluation, and the remainder of the medical screening examination    Princella PellegriniMaczis, Christin Mccreedy M, PA-C 12/04/17 1919    Pricilla LovelessGoldston, Scott, MD 12/05/17 [phone]

## 2017-12-04 NOTE — ED Notes (Signed)
Patient verbalizes understanding of discharge instructions. Opportunity for questioning and answers were provided. Ambulatory at discharge in NAD.  

## 2017-12-04 NOTE — ED Provider Notes (Signed)
[facility] EMERGENCY DEPARTMENT Provider Note   CSN: [id number] Arrival date & time: 12/04/17  1850     History   Chief Complaint Chief Complaint  Patient presents with  . Vaginal Bleeding  . Routine Prenatal Visit    HPI Monica Gamble is a 32 y.o. G1P0 female with a PMHx of anemia, asthma, and infertility, currently [redacted]w[redacted]d pregnant based on U/S done yesterday at [facility], who presents to the ED with complaints of slight lower abd cramping and scant vaginal spotting that began today.  Patient states that yesterday she went to her fertility clinic and had an ultrasound; chart review reveals that the U/S yesterday at [facility] clinic revealed a single intrauterine pregnancy measuring [redacted]w[redacted]d, no subchorionic hemorrhage or other abnormalities were seen.  She states that today she started having very slight lower abdominal cramping and a scant amount of vaginal spotting so she decided to come in for evaluation.  She describes the cramping as 1/10 intermittent nonradiating lower abdominal cramps that worsen when she is walking or moving around and have been improved with Tylenol.  She states that it feels like gas, she reports having a lot of flatulence lately.  The vaginal bleeding has been a scant pinkish spotting when she wipes, she has not noticed any in her panties or pantiliner, and there has not been any passage of clots.  She has also had some nausea for about a week, as expected for early pregnancy, denies any acute changes with this.  She is only sexually active with females, and she does not use any toys nor has she had any vaginal penetration except for the transvaginal U/S probe yesterday.  This pregnancy is a result of an IUI.  Of note, chart review reveals that she was seen in the ED on 11/23/17 for c/o abd pain and vaginal spotting; her work up was reassuring, including negative GC/CT/RPR/HIV, ABO/Rh O+, U/S showed single intrauterine gestational sac and yolk  sac without embryo seen.  She denies fevers, chills, CP, SOB, V/D/C, obstipation, melena, hematochezia, hematuria, dysuria, vaginal discharge, vaginal itching, genital sores, myalgias, arthralgias, numbness, tingling, focal weakness, or any other complaints at this time.   The history is provided by the patient and medical records. No language interpreter was used.  Vaginal Bleeding  Primary symptoms include vaginal bleeding.  Primary symptoms include no dysuria. Associated symptoms include abdominal pain and nausea. Pertinent negatives include no constipation, no diarrhea and no vomiting.    Past Medical History:  Diagnosis Date  . Anemia   . Asthma   . Encounter for artificial insemination 10/31/2017   [facility],  Trigger shot 10/29/17    There are no active problems to display for this patient.   History reviewed. No pertinent surgical history.   OB History    Gravida  1   Para      Term      Preterm      AB      Living        SAB      TAB      Ectopic      Multiple      Live Births               Home Medications    Prior to Admission medications   Medication Sig Start Date End Date Taking? Authorizing Provider  ferrous sulfate 325 (65 FE) MG tablet Take 325 mg by mouth 3 (three) times daily with meals.  [provider]  fluticasone (FLONASE) 50 MCG/ACT nasal spray Place 2 sprays into both nostrils daily. 01/30/17   Cathie Hoops, Amy V, PA-C  ibuprofen (ADVIL,MOTRIN) 600 MG tablet Take 1 tablet (600 mg total) by mouth every 6 (six) hours as needed. 09/29/17   Renne Crigler, PA-C  Prenatal Multivit-Min-Fe-FA (PRENATAL VITAMINS PO) Take by mouth.    [provider]    Family History No family history on file.  Social History Social History   Tobacco Use  . Smoking status: Never Smoker  . Smokeless tobacco: Never Used  Substance Use Topics  . Alcohol use: No  . Drug use: No     Allergies   Bactrim [sulfamethoxazole-trimethoprim]  and Doxycycline   Review of Systems Review of Systems  Constitutional: Negative for chills and fever.  Respiratory: Negative for shortness of breath.   Cardiovascular: Negative for chest pain.  Gastrointestinal: Positive for abdominal pain and nausea. Negative for blood in stool, constipation, diarrhea and vomiting.  Genitourinary: Positive for vaginal bleeding. Negative for dysuria, genital sores, hematuria, vaginal discharge and vaginal pain.  Musculoskeletal: Negative for arthralgias and myalgias.  Skin: Negative for color change.  Allergic/Immunologic: Negative for immunocompromised state.  Neurological: Negative for weakness and numbness.  Psychiatric/Behavioral: Negative for confusion.   All other systems reviewed and are negative for acute change except as noted in the HPI.    Physical Exam Updated Vital Signs BP 113/70 (BP Location: Right Arm)   Pulse 81   Temp 99.4 F (37.4 C) (Oral)   Resp 18   Ht 5\' 4"  (1.626 m)   Wt 89.8 kg   LMP 10/19/2017 (Exact Date) Comment: Trigger shot 10/29/17, IUI performed- 10/31/17- [facility]  SpO2 99%   BMI 33.99 kg/m   Physical Exam  Constitutional: She is oriented to person, place, and time. Vital signs are normal. She appears well-developed and well-nourished.  Non-toxic appearance. No distress.  Afebrile, nontoxic, NAD  HENT:  Head: Normocephalic and atraumatic.  Mouth/Throat: Oropharynx is clear and moist and mucous membranes are normal.  Eyes: Conjunctivae and EOM are normal. Right eye exhibits no discharge. Left eye exhibits no discharge.  Neck: Normal range of motion. Neck supple.  Cardiovascular: Normal rate, regular rhythm, normal heart sounds and intact distal pulses. Exam reveals no gallop and no friction rub.  No murmur heard. Pulmonary/Chest: Effort normal and breath sounds normal. No respiratory distress. She has no decreased breath sounds. She has no wheezes. She has no rhonchi. She has no rales.  Abdominal: Soft.  Normal appearance and bowel sounds are normal. She exhibits no distension. There is no tenderness. There is no rigidity, no rebound, no guarding, no CVA tenderness, no tenderness at McBurney's point and negative Murphy's sign.  Soft, NTND, +BS throughout, no r/g/r, neg murphy's, neg mcburney's, no CVA TTP   Musculoskeletal: Normal range of motion.  Neurological: She is alert and oriented to person, place, and time. She has normal strength. No sensory deficit.  Skin: Skin is warm, dry and intact. No rash noted.  Psychiatric: She has a normal mood and affect.  Nursing note and vitals reviewed.    ED Treatments / Results  Labs (all labs ordered are listed, but only abnormal results are displayed) Labs Reviewed  CBC WITH DIFFERENTIAL/PLATELET - Abnormal; Notable for the following components:      Result Value   WBC 11.9 (*)    Hemoglobin 11.5 (*)    MCH 25.0 (*)    RDW 15.7 (*)    Neutro  Abs 8.8 (*)    All other components within normal limits  HCG, QUANTITATIVE, PREGNANCY - Abnormal; Notable for the following components:   hCG, Beta Chain, Quant, Kathie Rhodes 409,[phone] (*)    All other components within normal limits  COMPREHENSIVE METABOLIC PANEL - Abnormal; Notable for the following components:   BUN 5 (*)    All other components within normal limits  I-STAT BETA HCG BLOOD, ED ([facility], [facility], [facility] ONLY) - Abnormal; Notable for the following components:   I-stat hCG, quantitative >2,000.0 (*)    All other components within normal limits  URINALYSIS, ROUTINE W REFLEX MICROSCOPIC  LIPASE, BLOOD   11/23/17: GC/CT neg, HIV/RPR neg 11/23/17: ABO/Rh A+   EKG None  Radiology No results found.   12/03/17: PROCEDURE NOTE EARLY OB USD DATING LMP: No LMP recorded. Trigger: 7.4.2019 Transfer Day 3 na Day 5 na  EGA: 6 weeks 5 days FINDINGS Uterus: normal Gestational present, not measured Sac:  Yolk sac: 3.7 CRL: 7.62 FCM: yes FCR: 138 SCH: Not seen Left ovary: Normal , 4.9 x 2.6 cm  Right ovary:  Normal , 4.9 x 2.9 cm ASSESSMENT: Viable SIUP S=D, 6 weeks and 5 days by dating and sono.    US Ob Comp < 14 Wks  Result Date: 11/23/2017 CLINICAL DATA:  Right-sided abdominal pain, quantitative hCG 8,776 EXAM: OBSTETRIC <14 WK [place] AND TRANSVAGINAL OB US TECHNIQUE: Both transabdominal and transvaginal ultrasound examinations were performed for complete evaluation of the gestation as well as the maternal uterus, adnexal regions, and pelvic cul-de-sac. Transvaginal technique was performed to assess early pregnancy. COMPARISON:  None. FINDINGS: Intrauterine gestational sac: Single intrauterine gestational sac Yolk sac:  Visible Embryo:  Not visible MSD: 8.7 mm   5 w   5 d Subchorionic hemorrhage:  None visualized. Maternal uterus/adnexae: Right ovary measures 3.2 by 5.4 x 2.8 cm and contains probable hemorrhagic corpus luteal body measuring 2.9 cm. The left ovary measures 3.6 by 4.9 x 2.3 cm. Area of slight increased echogenicity measuring 2.5 cm may reflect collapsing or hemorrhagic follicle. Trace free fluid in the pelvis. IMPRESSION: Single intrauterine gestational sac and yolk sac but no embryo. Follow-up ultrasound in 10-14 days could be obtained to confirm viability. Trace amount of free fluid in the pelvis. Electronically Signed   By: Jasmine Pang M.D.   On: 11/23/2017 14:57   US Ob Transvaginal  Result Date: 11/23/2017 CLINICAL DATA:  Right-sided abdominal pain, quantitative hCG 8,776 EXAM: OBSTETRIC <14 WK [place] AND TRANSVAGINAL OB US TECHNIQUE: Both transabdominal and transvaginal ultrasound examinations were performed for complete evaluation of the gestation as well as the maternal uterus, adnexal regions, and pelvic cul-de-sac. Transvaginal technique was performed to assess early pregnancy. COMPARISON:  None. FINDINGS: Intrauterine gestational sac: Single intrauterine gestational sac Yolk sac:  Visible Embryo:  Not visible MSD: 8.7 mm   5 w   5 d Subchorionic hemorrhage:  None visualized. Maternal  uterus/adnexae: Right ovary measures 3.2 by 5.4 x 2.8 cm and contains probable hemorrhagic corpus luteal body measuring 2.9 cm. The left ovary measures 3.6 by 4.9 x 2.3 cm. Area of slight increased echogenicity measuring 2.5 cm may reflect collapsing or hemorrhagic follicle. Trace free fluid in the pelvis. IMPRESSION: Single intrauterine gestational sac and yolk sac but no embryo. Follow-up ultrasound in 10-14 days could be obtained to confirm viability. Trace amount of free fluid in the pelvis. Electronically Signed   By: Jasmine Pang M.D.   On: 11/23/2017 14:57     Procedures Procedures (  including critical care time)  Medications Ordered in ED Medications  acetaminophen (TYLENOL) tablet 650 mg (650 mg Oral Given 12/04/17 1920)     Initial Impression / Assessment and Plan / ED Course  I have reviewed the triage vital signs and the nursing notes.  Pertinent labs & imaging results that were available during my care of the patient were reviewed by me and considered in my medical decision making (see chart for details).     32 y.o. female here with mild abdominal cramping and vaginal spotting that began today, had an ultrasound done yesterday at her fertility clinic which showed a single intrauterine pregnancy measuring 6 weeks 5 days.  She was also recently seen in the ED on 11/23/2017 and had an ultrasound at that time that showed an intrauterine gestational sac and a yolk sac but no embryo, STD testing was negative, ABO/Rh A+.  On exam, no abdominal tenderness, VSS, pt in NAD, appears comfortable. Work up thus far reveals: [employer] BetaHCG >2000; quantHCG [id number]; CBC w/diff with marginally elevated WBC 11.9 and mild chronic stable anemia, otherwise unremarkable; CMP WNL; lipase WNL.  I suspect her symptoms are related to the fact that she had a transvaginal ultrasound yesterday, likely had some cervical irritation leading to the spotting.  She is not describing a large amount of blood, and her  abdominal exam is reassuring.  Doubt need for repeat imaging or repeat pelvic exam.  Will check U/A to ensure no UTI, and reassess shortly.  9:44 PM U/A unremarkable. Overall, symptoms likely just from round ligament pain and cervical irritation from the transvaginal probe yesterday. Doubt threatened miscarriage given lack of significant bleeding or cramping. Advised pelvic rest, adequate hydration/rest, continuation of prenatal vitamins, tylenol for pain, will give diclegis for her nausea, given food guidelines for morning sickness, and advised f/up with OBGYN in 2-3 days for recheck of symptoms and ongoing prenatal care. Strict return precautions advised, [facility]ors[facility]. I explained the diagnosis and have given explicit precautions to return to the ER including for any other new or worsening symptoms. The patient understands and accepts the medical plan as it's been dictated and I have answered their questions. Discharge instructions concerning home care and prescriptions have been given. The patient is STABLE and is discharged to home in good condition.    Final Clinical Impressions(s) / ED Diagnoses   Final diagnoses:  Vaginal bleeding in pregnancy  Abdominal cramping affecting pregnancy  Chronic anemia  Nausea    ED Discharge Orders         Ordered    Doxylamine-Pyridoxine 10-10 MG TBEC     12/04/17 [address]2144           Tobby Fawcett, SylvaniaMercedes, [place] JerseyPA-C 12/04/17 2149    Melene PlanFloyd, Dan, DO 12/04/17 2219

## 2017-12-09 ENCOUNTER — Encounter (HOSPITAL_COMMUNITY): Payer: Self-pay | Admitting: Emergency Medicine

## 2017-12-09 ENCOUNTER — Emergency Department (HOSPITAL_COMMUNITY)
Admission: EM | Admit: 2017-12-09 | Discharge: 2017-12-09 | Disposition: A | Discharge status: Home / Self Care | Payer: BLUE CROSS/BLUE SHIELD | Attending: Emergency Medicine | Admitting: Emergency Medicine

## 2017-12-09 ENCOUNTER — Other Ambulatory Visit: Payer: Self-pay

## 2017-12-09 DIAGNOSIS — Z79899 Other long term (current) drug therapy: Secondary | ICD-10-CM | POA: Diagnosis not present

## 2017-12-09 DIAGNOSIS — Z3A08 8 weeks gestation of pregnancy: Secondary | ICD-10-CM | POA: Insufficient documentation

## 2017-12-09 DIAGNOSIS — M7918 Myalgia, other site: Secondary | ICD-10-CM

## 2017-12-09 DIAGNOSIS — W19XXXA Unspecified fall, initial encounter: Secondary | ICD-10-CM

## 2017-12-09 DIAGNOSIS — M545 Low back pain: Secondary | ICD-10-CM | POA: Insufficient documentation

## 2017-12-09 DIAGNOSIS — W010XXA Fall on same level from slipping, tripping and stumbling without subsequent striking against object, initial encounter: Secondary | ICD-10-CM | POA: Insufficient documentation

## 2017-12-09 DIAGNOSIS — O26891 Other specified pregnancy related conditions, first trimester: Secondary | ICD-10-CM | POA: Insufficient documentation

## 2017-12-09 DIAGNOSIS — O9989 Other specified diseases and conditions complicating pregnancy, childbirth and the puerperium: Secondary | ICD-10-CM | POA: Diagnosis not present

## 2017-12-09 MED ORDER — ACETAMINOPHEN 500 MG PO TABS
1000.0000 mg | ORAL_TABLET | Freq: Once | ORAL | Status: AC
  Administered 2017-12-09: 1000 mg via ORAL
  Filled 2017-12-09: qty 2

## 2017-12-09 NOTE — ED Notes (Signed)
Pt verbalized understanding of discharge instructions and denies any further questions at this time.   

## 2017-12-09 NOTE — Discharge Instructions (Addendum)
Please take Tylenol (acetaminophen) to relieve your pain.  You may take tylenol, up to 1,000 mg (two extra strength pills) every 6-8 hours for pain.  Do not take more than 4,000 mg tylenol in a 24 hour period.  Please check all medication labels as many medications such as pain and cold medications may contain tylenol. Please do not drink alcohol while taking this medication.   If you experience any vaginal bleeding/spotting or have any concerns please follow-up with your OB/GYN or go to the [facility] for additional care.  If you have worsening pain in your leg, or any other concerns please seek additional medical care and evaluation.  For morning sickness and nausea and vomiting in pregnancy you may take Diclegis.  This is an expensive prescription.  The active ingredients are the same as taking unisom or a similar brand (Doxylamine succinate) and vitamin B6 (Pyridoxine hydrochloride).  Please make sure the active ingredient matches the same name as there are many types of unisom and generic medications.  You may take 12.5 of unisom (or generic) every 4-6 hours, if needed you can take up to 25mg .  Please do not take more than 75mg  in 24 hours.   For the vitamin B6 (pyridoxine) you may take 10mg  up to twice a day.  The unisom may make you sleepy.

## 2017-12-09 NOTE — ED Notes (Signed)
Pt states that she slipped and fell landing on her bottom on her break at work today. She reports that her job is making her come get cleared with no restrictions prior to returning to work. Pt also reports she is 2 months pregnant.

## 2017-12-09 NOTE — ED Notes (Signed)
ED Provider at bedside. 

## 2017-12-09 NOTE — ED Provider Notes (Signed)
[facility] Provider Note   CSN: [id number] Arrival date & time: 12/09/17  0946     History   Chief Complaint Chief Complaint  Patient presents with  . Fall  . Back Pain    HPI Monica Gamble is a 32 y.o. female G1, P0, approximately 2 months pregnant, who presents today for evaluation of left-sided buttock pain after a mechanical fall this morning.  She reports that she was on a break at work when she slipped on the wet ground.  She says that she would not have come here, however her work made her come here for liability reasons.  She denies striking her head or passing out, no vaginal spotting or bleeding, no pelvic cramping or pregnancy related concerns today.  She reports pain in her left-sided buttock and lower back.  She is ambulatory however states that it hurts to walk.  She has not had any Tylenol today, no interventions tried prior to arrival.  She states multiple times that "I am okay I am off tomorrow so I can rest."  HPI  Past Medical History:  Diagnosis Date  . Anemia   . Asthma   . Encounter for artificial insemination 10/31/2017   [facility],  Trigger shot 10/29/17    There are no active problems to display for this patient.   History reviewed. No pertinent surgical history.   OB History    Gravida  1   Para      Term      Preterm      AB      Living        SAB      TAB      Ectopic      Multiple      Live Births               Home Medications    Prior to Admission medications   Medication Sig Start Date End Date Taking? Authorizing Provider  Doxylamine-Pyridoxine 10-10 MG TBEC 2 tablets on an empty stomach at bedtime on day 1 and 2. If symptoms persist, take 1 tablet in the morning and 2 tablets at bedtime on day 3. If symptoms persist, increase to 1 tablet in the morning, 1 tablet in the afternoon, and 2 tablets at bedtime daily. 12/04/17   Street, [place]Mercedes, PA-C  ferrous sulfate 325 (65  FE) MG tablet Take 325 mg by mouth 3 (three) times daily with meals.    [provider]  fluticasone (FLONASE) 50 MCG/ACT nasal spray Place 2 sprays into both nostrils daily. 01/30/17   Cathie HoopsYu, Amy V, PA-C  ibuprofen (ADVIL,MOTRIN) 600 MG tablet Take 1 tablet (600 mg total) by mouth every 6 (six) hours as needed. 09/29/17   Renne CriglerGeiple, Joshua, PA-C  Prenatal Multivit-Min-Fe-FA (PRENATAL VITAMINS PO) Take by mouth.    [provider]    Family History No family history on file.  Social History Social History   Tobacco Use  . Smoking status: Never Smoker  . Smokeless tobacco: Never Used  Substance Use Topics  . Alcohol use: No  . Drug use: No     Allergies   Bactrim [sulfamethoxazole-trimethoprim] and Doxycycline   Review of Systems Review of Systems  Genitourinary: Negative for menstrual problem, pelvic pain, vaginal bleeding, vaginal discharge and vaginal pain.  Musculoskeletal:       Left hip pain  Skin: Negative for wound.  Neurological: Negative for weakness and headaches.  All other systems  reviewed and are negative.    Physical Exam Updated Vital Signs BP 113/77 (BP Location: Right Arm)   Pulse 86   Temp 98.8 F (37.1 C) (Oral)   Resp 18   Ht 5\' 3"  (1.6 m)   Wt 89.8 kg   LMP 10/19/2017 (Exact Date) Comment: Trigger shot 10/29/17, IUI performed- 10/31/17- [facility]  SpO2 98%   BMI 35.07 kg/m   Physical Exam  Constitutional: She appears well-developed. No distress.  HENT:  Head: Normocephalic and atraumatic.  Cardiovascular: Intact distal pulses.  Left sided 2+ DP/PT pulses.  Pulmonary/Chest: Effort normal.  Abdominal: Soft. She exhibits no distension. There is no tenderness.  Musculoskeletal:  C/T/L post spine palpated without crepitus or deformities.  There is very mild left-sided lower lumbar midline/lateral tenderness to palpation.   ZOX:WRUEALLE:There is tenderness to palpation over the left buttock.  Internal and external rotation of left hip  produces pain.  Hip flexion is limited secondary to pain.  Full Knee and ankle ROM.  No Crepitis or deformities.  Ambulatory with slight limp.    Neurological: She is alert.  Station intact to left lower extremity.  Skin: Skin is warm and dry. She is not diaphoretic.  Psychiatric: She has a normal mood and affect. Her behavior is normal.  Nursing note and vitals reviewed.    ED Treatments / Results  Labs (all labs ordered are listed, but only abnormal results are displayed) Labs Reviewed - No data to display  EKG None  Radiology No results found.  Procedures Procedures (including critical care time)  Medications Ordered in ED Medications  acetaminophen (TYLENOL) tablet 1,000 mg (has no administration in time range)     Initial Impression / Assessment and Plan / ED Course  I have reviewed the triage vital signs and the nursing notes.  Pertinent labs & imaging results that were available during my care of the patient were reviewed by me and considered in my medical decision making (see chart for details).    Patient is a G1, P0 approximately 2 months pregnant who presents today for evaluation after a mechanical slip.  She ports her only injury is her left-sided buttock, denies any other injuries or pain, did not strike her head or pass out, no pregnancy related concerns today.  She states that she is only here because her job made her come before she could return to work due to liability reasons.  She states multiple times "I am fine" and says that she is able to walk.  She has diffuse tenderness to palpation over left-sided lower lumbar spine and paraspinal muscles along with left buttock pain.  Range of motion limited secondary to pain.  She is able to bear weight on the leg.  Discussed with her the risks/benefits of x-rays, especially given that she is in the first trimester pregnancy, and patient declined x-rays or additional evaluation.  Return to work note given.  Instructed on  Tylenol, conservative measures for muscle sprain.  Return precautions were discussed with patient who states their understanding.  At the time of discharge patient denied any unaddressed complaints or concerns.  Patient is agreeable for discharge home.    Final Clinical Impressions(s) / ED Diagnoses   Final diagnoses:  Fall, initial encounter  Left buttock pain    ED Discharge Orders    None       Norman ClayHammond, Ovella Manygoats W, PA-C 12/09/17 1145    Jacalyn LefevreHaviland, Julie, MD 12/09/17 1353

## 2017-12-09 NOTE — ED Triage Notes (Signed)
Pt with left leg pain with radiation to her back. She reports being at work when she slipped coming out of a store. She reports she walking with a limp, told to be seen because of liability.  She is 2 months preg. Denies any abdominal pain. A/O NAD at triage.

## 2017-12-17 DIAGNOSIS — Z3401 Encounter for supervision of normal first pregnancy, first trimester: Secondary | ICD-10-CM | POA: Diagnosis not present

## 2017-12-17 DIAGNOSIS — E669 Obesity, unspecified: Secondary | ICD-10-CM | POA: Diagnosis not present

## 2017-12-17 DIAGNOSIS — Z6834 Body mass index (BMI) 34.0-34.9, adult: Secondary | ICD-10-CM | POA: Diagnosis not present

## 2017-12-22 ENCOUNTER — Inpatient Hospital Stay (HOSPITAL_COMMUNITY)
Admission: AD | Admit: 2017-12-22 | Discharge: 2017-12-22 | Disposition: A | Discharge status: Home / Self Care | Payer: BLUE CROSS/BLUE SHIELD | Source: Ambulatory Visit | Attending: Obstetrics and Gynecology | Admitting: Obstetrics and Gynecology

## 2017-12-22 ENCOUNTER — Encounter (HOSPITAL_COMMUNITY): Payer: Self-pay | Admitting: *Deleted

## 2017-12-22 ENCOUNTER — Inpatient Hospital Stay (HOSPITAL_COMMUNITY): Payer: BLUE CROSS/BLUE SHIELD

## 2017-12-22 DIAGNOSIS — Z3401 Encounter for supervision of normal first pregnancy, first trimester: Secondary | ICD-10-CM | POA: Diagnosis not present

## 2017-12-22 DIAGNOSIS — O26891 Other specified pregnancy related conditions, first trimester: Secondary | ICD-10-CM | POA: Diagnosis not present

## 2017-12-22 DIAGNOSIS — O3680X0 Pregnancy with inconclusive fetal viability, not applicable or unspecified: Secondary | ICD-10-CM

## 2017-12-22 DIAGNOSIS — O209 Hemorrhage in early pregnancy, unspecified: Secondary | ICD-10-CM

## 2017-12-22 DIAGNOSIS — R1031 Right lower quadrant pain: Secondary | ICD-10-CM | POA: Insufficient documentation

## 2017-12-22 DIAGNOSIS — O418X10 Other specified disorders of amniotic fluid and membranes, first trimester, not applicable or unspecified: Secondary | ICD-10-CM

## 2017-12-22 DIAGNOSIS — O208 Other hemorrhage in early pregnancy: Secondary | ICD-10-CM | POA: Insufficient documentation

## 2017-12-22 DIAGNOSIS — N760 Acute vaginitis: Secondary | ICD-10-CM | POA: Insufficient documentation

## 2017-12-22 DIAGNOSIS — Z3A09 9 weeks gestation of pregnancy: Secondary | ICD-10-CM | POA: Diagnosis not present

## 2017-12-22 DIAGNOSIS — O468X1 Other antepartum hemorrhage, first trimester: Secondary | ICD-10-CM

## 2017-12-22 DIAGNOSIS — B9689 Other specified bacterial agents as the cause of diseases classified elsewhere: Secondary | ICD-10-CM

## 2017-12-22 DIAGNOSIS — Z3400 Encounter for supervision of normal first pregnancy, unspecified trimester: Secondary | ICD-10-CM

## 2017-12-22 LAB — WET PREP, GENITAL
SPERM: NONE SEEN
Trich, Wet Prep: NONE SEEN
Yeast Wet Prep HPF POC: NONE SEEN

## 2017-12-22 LAB — URINALYSIS, ROUTINE W REFLEX MICROSCOPIC
Bilirubin Urine: NEGATIVE
GLUCOSE, UA: NEGATIVE mg/dL
Hgb urine dipstick: NEGATIVE
KETONES UR: NEGATIVE mg/dL
Leukocytes, UA: NEGATIVE
NITRITE: NEGATIVE
PH: 5 (ref 5.0–8.0)
Protein, ur: 30 mg/dL — AB
SPECIFIC GRAVITY, URINE: 1.027 (ref 1.005–1.030)

## 2017-12-22 LAB — CBC
HEMATOCRIT: 35.7 % — AB (ref 36.0–46.0)
HEMOGLOBIN: 12 g/dL (ref 12.0–15.0)
MCH: 26.1 pg (ref 26.0–34.0)
MCHC: 33.6 g/dL (ref 30.0–36.0)
MCV: 77.6 fL — AB (ref 78.0–100.0)
Platelets: 316 10*3/uL (ref 150–400)
RBC: 4.6 MIL/uL (ref 3.87–5.11)
RDW: 14.9 % (ref 11.5–15.5)
WBC: 12.6 10*3/uL — ABNORMAL HIGH (ref 4.0–10.5)

## 2017-12-22 LAB — HCG, QUANTITATIVE, PREGNANCY: hCG, Beta Chain, Quant, S: 198923 m[IU]/mL — ABNORMAL HIGH (ref <5)

## 2017-12-22 MED ORDER — METRONIDAZOLE 500 MG PO TABS: 500.0000 mg | ORAL_TABLET | Freq: Two times a day (BID) | ORAL | 0 refills | Status: DC

## 2017-12-22 MED ORDER — ACIDOPHILUS LACTOBACILLUS PO CAPS: 1.0000 | ORAL_CAPSULE | Freq: Every day | ORAL | 3 refills | Status: AC | PRN

## 2017-12-22 NOTE — Discharge Instructions (Signed)

## 2017-12-22 NOTE — MAU Provider Note (Signed)
History     CSN: [id number]  Arrival date and time: 12/22/17 4098   First Provider Initiated Contact with Patient 12/22/17 [phone]      Chief Complaint  Patient presents with  . Vaginal Bleeding  . Abdominal Pain  . Back Pain   HPI  Monica Gamble is a 32 y.o. G1P0 at [redacted]w[redacted]d who presents to MAU with chief complaints of RLQ pain and vaginal spotting. Denies abnormal vaginal discharge, fever, or recent illness.    RLQ pain This is a new problem. Onset yesterday. Pain is 7/10, "sharp" and "crampy". Does not radiate. No aggravating or alleviating factors. Has not attempted to manage with medication.  Vaginal spotting This is a new problem. Onset yesterday, coinciding with RLQ pain. Endorses seeing pale pink vaginal spotting when wiping after voiding. States discharge now appears pale brown rather than pink or red. Denies sexual intercourse.  Fall Patient reports she fell while grocery shopping approximately one week ago. States she felt sharp pain 9/10 across her lower back followed by weakness in her knees and experienced a slow slide to the floor and into a "position like the splits". Denies abdominal trauma. Denies pain after fall. Denies loss of consciousness or shortness of breath.  OB History    Gravida  1   Para      Term      Preterm      AB      Living        SAB      TAB      Ectopic      Multiple      Live Births              Past Medical History:  Diagnosis Date  . Anemia   . Asthma   . Encounter for artificial insemination 10/31/2017   [facility],  Trigger shot 10/29/17    Past Surgical History:  Procedure Laterality Date  . NO PAST SURGERIES      No family history on file.  Social History   Tobacco Use  . Smoking status: Never Smoker  . Smokeless tobacco: Never Used  Substance Use Topics  . Alcohol use: No  . Drug use: No    Allergies:  Allergies  Allergen Reactions  . Bactrim [Sulfamethoxazole-Trimethoprim] Hives  .  Doxycycline Nausea And Vomiting    Medications Prior to Admission  Medication Sig Dispense Refill Last Dose  . Doxylamine-Pyridoxine 10-10 MG TBEC 2 tablets on an empty stomach at bedtime on day 1 and 2. If symptoms persist, take 1 tablet in the morning and 2 tablets at bedtime on day 3. If symptoms persist, increase to 1 tablet in the morning, 1 tablet in the afternoon, and 2 tablets at bedtime daily. 60 tablet 0   . ferrous sulfate 325 (65 FE) MG tablet Take 325 mg by mouth 3 (three) times daily with meals.   09/22/2015 at Unknown time  . fluticasone (FLONASE) 50 MCG/ACT nasal spray Place 2 sprays into both nostrils daily. 1 g 0   . ibuprofen (ADVIL,MOTRIN) 600 MG tablet Take 1 tablet (600 mg total) by mouth every 6 (six) hours as needed. 20 tablet 0   . Prenatal Multivit-Min-Fe-FA (PRENATAL VITAMINS PO) Take by mouth.       Review of Systems  Respiratory: Negative for shortness of breath.   Gastrointestinal: Positive for abdominal pain.       RLQ  Genitourinary: Positive for vaginal bleeding. Negative for difficulty urinating, dyspareunia and dysuria.  Musculoskeletal: Negative for back pain.  Neurological: Negative for light-headedness and headaches.  All other systems reviewed and are negative.  Physical Exam   Blood pressure 119/72, pulse 68, temperature 98.2 F (36.8 C), temperature source Oral, resp. rate 18, weight 89.8 kg, last menstrual period 10/19/2017.  Physical Exam  Nursing note and vitals reviewed. Constitutional: She is oriented to person, place, and time. She appears well-developed and well-nourished.  Cardiovascular: Normal rate.  Respiratory: Effort normal and breath sounds normal.  GI: Soft.  Genitourinary: Vagina normal and uterus normal. No vaginal discharge found.  Musculoskeletal: Normal range of motion.  Neurological: She is alert and oriented to person, place, and time. She has normal reflexes.  Skin: Skin is warm and dry.  Psychiatric: She has a normal  mood and affect. Her behavior is normal. Judgment and thought content normal.    MAU Course  Procedures  MDM -- Patient Vitals for the past 24 hrs:  BP Temp Temp src Pulse Resp Weight  12/22/17 1238 119/64 - - 72 18 -  12/22/17 0928 119/72 98.2 F (36.8 C) Oral 68 18 -  12/22/17 0910 - - - - - 89.8 kg    Orders Placed This Encounter  Procedures  . Wet prep, genital    Standing Status:   Standing    Number of Occurrences:   1  . US OB LESS THAN 14 WEEKS WITH OB TRANSVAGINAL    9 weeks, new onset bleeding, hCG ordered    Standing Status:   Standing    Number of Occurrences:   1    Order Specific Question:   Symptom/Reason for Exam    Answer:   Pregnancy with inconclusive fetal viability [V23.87.ICD-9-CM]    Order Specific Question:   Symptom/Reason for Exam    Answer:   Vaginal bleeding in pregnancy, first trimester [161096][[id number]]  . Urinalysis, Routine w reflex microscopic    Standing Status:   Standing    Number of Occurrences:   1  . CBC    Standing Status:   Standing    Number of Occurrences:   1  . hCG, quantitative, pregnancy    Standing Status:   Standing    Number of Occurrences:   1   Results for orders placed or performed during the hospital encounter of 12/22/17 (from the past 24 hour(s))  CBC     Status: Abnormal   Collection Time: 12/22/17  9:17 AM  Result Value Ref Range   WBC 12.6 (H) 4.0 - 10.5 K/uL   RBC 4.60 3.87 - 5.11 MIL/uL   Hemoglobin 12.0 12.0 - 15.0 g/dL   HCT 04.535.7 (L) 40.936.0 - 81.146.0 %   MCV 77.6 (L) 78.0 - 100.0 fL   MCH 26.1 26.0 - 34.0 pg   MCHC 33.6 30.0 - 36.0 g/dL   RDW 91.414.9 78.211.5 - 95.615.5 %   Platelets 316 150 - 400 K/uL  hCG, quantitative, pregnancy     Status: Abnormal   Collection Time: 12/22/17  9:17 AM  Result Value Ref Range   hCG, Beta Chain, Quant, S 213,[id number] (H) <5 mIU/mL  Urinalysis, Routine w reflex microscopic     Status: Abnormal   Collection Time: 12/22/17  9:21 AM  Result Value Ref Range   Color, Urine YELLOW YELLOW    APPearance CLEAR CLEAR   Specific Gravity, Urine 1.027 1.005 - 1.030   pH 5.0 5.0 - 8.0   Glucose, UA NEGATIVE NEGATIVE mg/dL   Hgb urine dipstick NEGATIVE NEGATIVE  Bilirubin Urine NEGATIVE NEGATIVE   Ketones, ur NEGATIVE NEGATIVE mg/dL   Protein, ur 30 (A) NEGATIVE mg/dL   Nitrite NEGATIVE NEGATIVE   Leukocytes, UA NEGATIVE NEGATIVE   RBC / HPF 11-20 0 - 5 RBC/hpf   WBC, UA 0-5 0 - 5 WBC/hpf   Bacteria, UA FEW (A) NONE SEEN   Squamous Epithelial / LPF 0-5 0 - 5   Mucus PRESENT    Ca Oxalate Crys, UA PRESENT   Wet prep, genital     Status: Abnormal   Collection Time: 12/22/17  9:47 AM  Result Value Ref Range   Yeast Wet Prep HPF POC NONE SEEN NONE SEEN   Trich, Wet Prep NONE SEEN NONE SEEN   Clue Cells Wet Prep HPF POC PRESENT (A) NONE SEEN   WBC, Wet Prep HPF POC FEW (A) NONE SEEN   Sperm NONE SEEN    US Ob Transvaginal  Result Date: 12/22/2017 CLINICAL DATA:  Pregnancy of inconclusive fetal viability, vaginal bleeding in first trimester of pregnancy EXAM: TRANSVAGINAL OB ULTRASOUND TECHNIQUE: Transvaginal ultrasound was performed for complete evaluation of the gestation as well as the maternal uterus, adnexal regions, and pelvic cul-de-sac. COMPARISON:  11/23/2017 FINDINGS: Intrauterine gestational sac: Present, single Yolk sac:  Present Embryo:  Present Cardiac Activity: Present Heart Rate: 169 bpm CRL:   26.9 mm   9 w 3 d                  [place] EDC: 07/24/2018 Subchorionic hemorrhage: Small subchorionic hemorrhage present 12 x 18 x 14 mm Maternal uterus/adnexae: RIGHT ovary normal size and morphology, 4.9 x 2.8 x 3.2 cm. LEFT ovary normal size and morphology, 4.2 x 2.5 x 2.7 cm. No adnexal masses or free pelvic fluid. IMPRESSION: Single live intrauterine gestation at 9 weeks 3 days EGA by crown-rump length. Small subchronic hemorrhage. Electronically Signed   By: Ulyses Southward M.D.   On: 12/22/2017 11:49    Meds ordered this encounter  Medications  . metroNIDAZOLE (FLAGYL) 500 MG  tablet    Sig: Take 1 tablet (500 mg total) by mouth 2 (two) times daily.    Dispense:  14 tablet    Refill:  0    Order Specific Question:   Supervising Provider    Answer:   Reva Bores [[id number]]  . Acidophilus Lactobacillus CAPS    Sig: Take 1 tablet by mouth daily as needed.    Dispense:  30 capsule    Refill:  3    Order Specific Question:   Supervising Provider    Answer:   Reva Bores [[id number]]    Assessment and Plan  --32 y.o. G1P0 with IUP at [redacted]w[redacted]d  --Subchorionic hemorrhage, discussed pelvic rest, possibility of future bleeding episodes --Bacterial vaginosis, rx Flagyl plus optional probiotic supplement or food with live cultures --Given safe medication in pregnancy handout --Reviewed general obstetric precautions including but not limited to falls, fever, vaginal bleeding, lheadache not relieved by Tylenol, rest and PO hydration. --Discharge home in stable condition  F/U: patient has New OB appt with [facility] 01/15/18   Calvert Cantor, CNM 12/22/2017, 12:43 PM

## 2017-12-22 NOTE — MAU Note (Signed)
Pt started spotting yesterday, pink, also started having RLQ pain. Sees spotting with wiping, is now brown instead of pink.  Also started having lower back pain that radiates into her buttocks & down her legs.

## 2017-12-23 LAB — GC/CHLAMYDIA PROBE AMP (~~LOC~~) NOT AT ARMC
CHLAMYDIA, DNA PROBE: NEGATIVE
NEISSERIA GONORRHEA: NEGATIVE

## 2018-01-11 DIAGNOSIS — Z3491 Encounter for supervision of normal pregnancy, unspecified, first trimester: Secondary | ICD-10-CM | POA: Diagnosis not present

## 2018-01-11 DIAGNOSIS — Z3A12 12 weeks gestation of pregnancy: Secondary | ICD-10-CM | POA: Diagnosis not present

## 2018-01-11 DIAGNOSIS — Z315 Encounter for genetic counseling: Secondary | ICD-10-CM | POA: Diagnosis not present

## 2018-01-11 DIAGNOSIS — Z363 Encounter for antenatal screening for malformations: Secondary | ICD-10-CM | POA: Diagnosis not present

## 2018-01-11 DIAGNOSIS — O351XX0 Maternal care for (suspected) chromosomal abnormality in fetus, not applicable or unspecified: Secondary | ICD-10-CM | POA: Diagnosis not present

## 2018-01-15 DIAGNOSIS — Z6834 Body mass index (BMI) 34.0-34.9, adult: Secondary | ICD-10-CM | POA: Diagnosis not present

## 2018-01-15 DIAGNOSIS — O99211 Obesity complicating pregnancy, first trimester: Secondary | ICD-10-CM | POA: Diagnosis not present

## 2018-01-15 DIAGNOSIS — E6609 Other obesity due to excess calories: Secondary | ICD-10-CM | POA: Diagnosis not present

## 2018-01-15 DIAGNOSIS — Z3401 Encounter for supervision of normal first pregnancy, first trimester: Secondary | ICD-10-CM | POA: Diagnosis not present

## 2018-03-01 DIAGNOSIS — Z363 Encounter for antenatal screening for malformations: Secondary | ICD-10-CM | POA: Diagnosis not present

## 2018-03-01 DIAGNOSIS — Z3A19 19 weeks gestation of pregnancy: Secondary | ICD-10-CM | POA: Diagnosis not present

## 2018-03-01 DIAGNOSIS — Z8279 Family history of other congenital malformations, deformations and chromosomal abnormalities: Secondary | ICD-10-CM | POA: Diagnosis not present

## 2018-05-07 DIAGNOSIS — Z3402 Encounter for supervision of normal first pregnancy, second trimester: Secondary | ICD-10-CM | POA: Diagnosis not present

## 2018-05-19 DIAGNOSIS — O26843 Uterine size-date discrepancy, third trimester: Secondary | ICD-10-CM | POA: Diagnosis not present

## 2018-05-19 DIAGNOSIS — Z363 Encounter for antenatal screening for malformations: Secondary | ICD-10-CM | POA: Diagnosis not present

## 2018-05-19 DIAGNOSIS — Z3A30 30 weeks gestation of pregnancy: Secondary | ICD-10-CM | POA: Diagnosis not present

## 2018-06-11 DIAGNOSIS — Z23 Encounter for immunization: Secondary | ICD-10-CM | POA: Diagnosis not present

## 2018-06-22 DIAGNOSIS — R002 Palpitations: Secondary | ICD-10-CM | POA: Diagnosis not present

## 2018-06-22 DIAGNOSIS — Z6837 Body mass index (BMI) 37.0-37.9, adult: Secondary | ICD-10-CM | POA: Diagnosis not present

## 2018-06-30 ENCOUNTER — Encounter (HOSPITAL_BASED_OUTPATIENT_CLINIC_OR_DEPARTMENT_OTHER): Payer: Self-pay | Admitting: Emergency Medicine

## 2018-06-30 ENCOUNTER — Other Ambulatory Visit: Payer: Self-pay

## 2018-06-30 ENCOUNTER — Emergency Department (HOSPITAL_BASED_OUTPATIENT_CLINIC_OR_DEPARTMENT_OTHER)
Admission: EM | Admit: 2018-06-30 | Discharge: 2018-06-30 | Disposition: A | Discharge status: Home / Self Care | Payer: BLUE CROSS/BLUE SHIELD | Attending: Emergency Medicine | Admitting: Emergency Medicine

## 2018-06-30 DIAGNOSIS — Z3A37 37 weeks gestation of pregnancy: Secondary | ICD-10-CM

## 2018-06-30 DIAGNOSIS — F419 Anxiety disorder, unspecified: Secondary | ICD-10-CM | POA: Insufficient documentation

## 2018-06-30 DIAGNOSIS — Z79899 Other long term (current) drug therapy: Secondary | ICD-10-CM | POA: Insufficient documentation

## 2018-06-30 DIAGNOSIS — R109 Unspecified abdominal pain: Secondary | ICD-10-CM

## 2018-06-30 DIAGNOSIS — O26899 Other specified pregnancy related conditions, unspecified trimester: Secondary | ICD-10-CM | POA: Insufficient documentation

## 2018-06-30 DIAGNOSIS — O9989 Other specified diseases and conditions complicating pregnancy, childbirth and the puerperium: Secondary | ICD-10-CM | POA: Diagnosis not present

## 2018-06-30 DIAGNOSIS — O26893 Other specified pregnancy related conditions, third trimester: Secondary | ICD-10-CM | POA: Diagnosis not present

## 2018-06-30 DIAGNOSIS — R102 Pelvic and perineal pain: Secondary | ICD-10-CM | POA: Diagnosis not present

## 2018-06-30 HISTORY — DX: Anxiety disorder, unspecified: F41.9

## 2018-06-30 HISTORY — DX: Palpitations: R00.2

## 2018-06-30 LAB — URINALYSIS, ROUTINE W REFLEX MICROSCOPIC
BILIRUBIN URINE: NEGATIVE
Glucose, UA: NEGATIVE mg/dL
Hgb urine dipstick: NEGATIVE
KETONES UR: 15 mg/dL — AB
NITRITE: NEGATIVE
Protein, ur: NEGATIVE mg/dL
Specific Gravity, Urine: >1.03 — ABNORMAL HIGH (ref 1.005–1.030)
pH: 6 (ref 5.0–8.0)

## 2018-06-30 LAB — URINALYSIS, MICROSCOPIC (REFLEX)

## 2018-06-30 MED ORDER — SODIUM CHLORIDE 0.9 % IV BOLUS: 1000.0000 mL | Freq: Once | INTRAVENOUS | Status: DC

## 2018-06-30 NOTE — ED Notes (Signed)
Amy RN, charge nurse spoke with OB, off toco monitoring . Will monitor BP
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
## 2018-06-30 NOTE — Discharge Instructions (Addendum)
You have been diagnosed today with abdominal pressure and a 37 pregnant female, anxiety.  At this time there does not appear to be the presence of an emergent medical condition, however there is always the potential for conditions to change. Please read and follow the below instructions.  Please return to the Emergency Department immediately for any new or worsening symptoms. Please be sure to follow up with your Primary Care Provider within one week regarding your visit today; please call their office to schedule an appointment even if you are feeling better for a follow-up visit. Please call your OB/GYN today to discuss this visit to reschedule your OB/GYN appointment.  Get help right away if: Your water breaks before 37 weeks. You have regular contractions less than 10 minutes apart before 37 weeks. You have a fever. You are leaking fluid from your vagina. You have spotting or bleeding from your vagina. You have severe abdominal pain or cramping. You have rapid weight loss or weight gain. You have shortness of breath with chest pain. You notice sudden or extreme swelling of your face, hands, ankles, feet, or legs. Your baby makes fewer than 10 movements in 2 hours. You have severe headaches that do not go away when you take medicine. You have vision changes. Any new or concerning symptoms  Please read the additional information packets attached to your discharge summary.  Do not take your medicine if  develop an itchy rash, swelling in your mouth or lips, or difficulty breathing.

## 2018-06-30 NOTE — ED Notes (Signed)
ED Provider at bedside. 
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
## 2018-06-30 NOTE — ED Notes (Signed)
Ambulated to BR, without assistance, spouse at bedside

## 2018-07-02 DIAGNOSIS — Z3403 Encounter for supervision of normal first pregnancy, third trimester: Secondary | ICD-10-CM | POA: Diagnosis not present

## 2018-07-02 DIAGNOSIS — O2243 Hemorrhoids in pregnancy, third trimester: Secondary | ICD-10-CM | POA: Diagnosis not present

## 2018-07-07 DIAGNOSIS — R002 Palpitations: Secondary | ICD-10-CM | POA: Diagnosis not present

## 2018-07-07 DIAGNOSIS — F419 Anxiety disorder, unspecified: Secondary | ICD-10-CM | POA: Diagnosis not present

## 2018-07-07 DIAGNOSIS — Z3403 Encounter for supervision of normal first pregnancy, third trimester: Secondary | ICD-10-CM | POA: Diagnosis not present

## 2018-07-12 DIAGNOSIS — Z3483 Encounter for supervision of other normal pregnancy, third trimester: Secondary | ICD-10-CM | POA: Diagnosis not present

## 2018-07-12 DIAGNOSIS — R030 Elevated blood-pressure reading, without diagnosis of hypertension: Secondary | ICD-10-CM | POA: Diagnosis not present

## 2018-07-12 DIAGNOSIS — R002 Palpitations: Secondary | ICD-10-CM | POA: Diagnosis not present

## 2018-07-31 DIAGNOSIS — O9952 Diseases of the respiratory system complicating childbirth: Secondary | ICD-10-CM | POA: Diagnosis not present

## 2018-07-31 DIAGNOSIS — F419 Anxiety disorder, unspecified: Secondary | ICD-10-CM | POA: Diagnosis not present

## 2018-07-31 DIAGNOSIS — Z3A41 41 weeks gestation of pregnancy: Secondary | ICD-10-CM | POA: Diagnosis not present

## 2018-07-31 DIAGNOSIS — Z882 Allergy status to sulfonamides status: Secondary | ICD-10-CM | POA: Diagnosis not present

## 2018-07-31 DIAGNOSIS — O480 Post-term pregnancy: Secondary | ICD-10-CM | POA: Diagnosis not present

## 2018-07-31 DIAGNOSIS — J4530 Mild persistent asthma, uncomplicated: Secondary | ICD-10-CM | POA: Diagnosis not present

## 2018-07-31 DIAGNOSIS — O99214 Obesity complicating childbirth: Secondary | ICD-10-CM | POA: Diagnosis not present

## 2018-07-31 DIAGNOSIS — D62 Acute posthemorrhagic anemia: Secondary | ICD-10-CM | POA: Diagnosis not present

## 2018-07-31 DIAGNOSIS — O9081 Anemia of the puerperium: Secondary | ICD-10-CM | POA: Diagnosis not present

## 2018-07-31 DIAGNOSIS — O99344 Other mental disorders complicating childbirth: Secondary | ICD-10-CM | POA: Diagnosis not present

## 2018-07-31 DIAGNOSIS — R002 Palpitations: Secondary | ICD-10-CM | POA: Diagnosis not present

## 2018-08-02 DIAGNOSIS — R002 Palpitations: Secondary | ICD-10-CM | POA: Diagnosis not present

## 2018-08-09 DIAGNOSIS — R030 Elevated blood-pressure reading, without diagnosis of hypertension: Secondary | ICD-10-CM | POA: Diagnosis not present

## 2018-08-09 DIAGNOSIS — O9989 Other specified diseases and conditions complicating pregnancy, childbirth and the puerperium: Secondary | ICD-10-CM | POA: Diagnosis not present

## 2018-10-18 ENCOUNTER — Encounter (HOSPITAL_COMMUNITY): Payer: Self-pay

## 2019-09-23 DIAGNOSIS — N921 Excessive and frequent menstruation with irregular cycle: Secondary | ICD-10-CM | POA: Diagnosis not present

## 2019-10-12 ENCOUNTER — Other Ambulatory Visit: Payer: Self-pay

## 2019-10-12 ENCOUNTER — Emergency Department (HOSPITAL_BASED_OUTPATIENT_CLINIC_OR_DEPARTMENT_OTHER)
Admission: EM | Admit: 2019-10-12 | Discharge: 2019-10-12 | Disposition: A | Discharge status: Home / Self Care | Payer: BC Managed Care – PPO | Attending: Emergency Medicine | Admitting: Emergency Medicine

## 2019-10-12 ENCOUNTER — Emergency Department (HOSPITAL_BASED_OUTPATIENT_CLINIC_OR_DEPARTMENT_OTHER): Payer: BC Managed Care – PPO

## 2019-10-12 ENCOUNTER — Encounter (HOSPITAL_BASED_OUTPATIENT_CLINIC_OR_DEPARTMENT_OTHER): Payer: Self-pay | Admitting: Emergency Medicine

## 2019-10-12 DIAGNOSIS — J069 Acute upper respiratory infection, unspecified: Secondary | ICD-10-CM | POA: Insufficient documentation

## 2019-10-12 DIAGNOSIS — Z20822 Contact with and (suspected) exposure to covid-19: Secondary | ICD-10-CM

## 2019-10-12 DIAGNOSIS — R0981 Nasal congestion: Secondary | ICD-10-CM | POA: Diagnosis not present

## 2019-10-12 DIAGNOSIS — R059 Cough, unspecified: Secondary | ICD-10-CM

## 2019-10-12 DIAGNOSIS — J45909 Unspecified asthma, uncomplicated: Secondary | ICD-10-CM | POA: Insufficient documentation

## 2019-10-12 DIAGNOSIS — I10 Essential (primary) hypertension: Secondary | ICD-10-CM | POA: Diagnosis not present

## 2019-10-12 DIAGNOSIS — F17200 Nicotine dependence, unspecified, uncomplicated: Secondary | ICD-10-CM | POA: Diagnosis not present

## 2019-10-12 DIAGNOSIS — R0602 Shortness of breath: Secondary | ICD-10-CM

## 2019-10-12 DIAGNOSIS — R05 Cough: Secondary | ICD-10-CM | POA: Diagnosis not present

## 2019-10-12 LAB — CBC WITH DIFFERENTIAL/PLATELET
Abs Immature Granulocytes: 0.03 10*3/uL (ref 0.00–0.07)
Basophils Absolute: 0.1 10*3/uL (ref 0.0–0.1)
Basophils Relative: 1 %
Eosinophils Absolute: 0.4 10*3/uL (ref 0.0–0.5)
Eosinophils Relative: 4 %
HCT: 36.3 % (ref 36.0–46.0)
Hemoglobin: 11.3 g/dL — ABNORMAL LOW (ref 12.0–15.0)
Immature Granulocytes: 0 %
Lymphocytes Relative: 25 %
Lymphs Abs: 2.4 10*3/uL (ref 0.7–4.0)
MCH: 24.5 pg — ABNORMAL LOW (ref 26.0–34.0)
MCHC: 31.1 g/dL (ref 30.0–36.0)
MCV: 78.6 fL — ABNORMAL LOW (ref 80.0–100.0)
Monocytes Absolute: 0.7 10*3/uL (ref 0.1–1.0)
Monocytes Relative: 8 %
Neutro Abs: 5.9 10*3/uL (ref 1.7–7.7)
Neutrophils Relative %: 62 %
Platelets: 378 10*3/uL (ref 150–400)
RBC: 4.62 MIL/uL (ref 3.87–5.11)
RDW: 15.2 % (ref 11.5–15.5)
WBC: 9.6 10*3/uL (ref 4.0–10.5)
nRBC: 0 % (ref 0.0–0.2)

## 2019-10-12 LAB — COMPREHENSIVE METABOLIC PANEL
ALT: 12 U/L (ref 0–44)
AST: 14 U/L — ABNORMAL LOW (ref 15–41)
Albumin: 4.1 g/dL (ref 3.5–5.0)
Alkaline Phosphatase: 50 U/L (ref 38–126)
Anion gap: 8 (ref 5–15)
BUN: 10 mg/dL (ref 6–20)
CO2: 25 mmol/L (ref 22–32)
Calcium: 9.3 mg/dL (ref 8.9–10.3)
Chloride: 104 mmol/L (ref 98–111)
Creatinine, Ser: 0.83 mg/dL (ref 0.44–1.00)
GFR calc Af Amer: >60 mL/min (ref >60)
GFR calc non Af Amer: >60 mL/min (ref >60)
Glucose, Bld: 89 mg/dL (ref 70–99)
Potassium: 3.6 mmol/L (ref 3.5–5.1)
Sodium: 137 mmol/L (ref 135–145)
Total Bilirubin: 0.5 mg/dL (ref 0.3–1.2)
Total Protein: 8 g/dL (ref 6.5–8.1)

## 2019-10-12 LAB — SARS CORONAVIRUS 2 BY RT PCR (HOSPITAL ORDER, PERFORMED IN ~~LOC~~ HOSPITAL LAB): SARS Coronavirus 2: NEGATIVE

## 2019-10-12 NOTE — ED Notes (Signed)
Provider at bedside

## 2019-10-12 NOTE — ED Provider Notes (Signed)
[facility] EMERGENCY DEPARTMENT Provider Note   CSN: [id number] Arrival date & time: 10/12/19  1821     History Chief Complaint  Patient presents with  . Sore Throat  . Cough  . Weakness    Monica Gamble is a 34 y.o. female.  The history is provided by the patient and medical records. No language interpreter was used.  URI Presenting symptoms: congestion, cough, fatigue, fever, rhinorrhea and sore throat   Severity:  Moderate Onset quality:  Gradual Timing:  Constant Progression:  Unchanged Relieved by:  Nothing Worsened by:  Nothing Ineffective treatments:  None tried Associated symptoms: myalgias and sneezing   Associated symptoms: no headaches, no neck pain and no wheezing   Risk factors: recent travel and sick contacts        Past Medical History:  Diagnosis Date  . Anemia   . Anxiety   . Asthma   . Encounter for artificial insemination 10/31/2017   [facility],  Trigger shot 10/29/17  . Palpitations     Patient Active Problem List   Diagnosis Date Noted  . Supervision of normal first pregnancy, antepartum 12/22/2017  . Subchorionic hematoma in first trimester 12/22/2017    Past Surgical History:  Procedure Laterality Date  . NO PAST SURGERIES       OB History    Gravida  1   Para      Term      Preterm      AB      Living        SAB      TAB      Ectopic      Multiple      Live Births  0           History reviewed. No pertinent family history.  Social History   Tobacco Use  . Smoking status: Never Smoker  . Smokeless tobacco: Never Used  Vaping Use  . Vaping Use: Never used  Substance Use Topics  . Alcohol use: No  . Drug use: No    Home Medications Prior to Admission medications   Medication Sig Start Date End Date Taking? Authorizing Provider  Acidophilus Lactobacillus CAPS Take 1 tablet by mouth daily as needed. 12/22/17   Calvert Cantor, CNM  clonazePAM (KLONOPIN) 0.5 MG tablet  06/24/18    [provider]  metroNIDAZOLE (FLAGYL) 500 MG tablet Take 1 tablet (500 mg total) by mouth 2 (two) times daily. 12/22/17   Calvert Cantor, CNM  Prenatal Multivit-Min-Fe-FA (PRENATAL VITAMINS PO) Take 1 tablet by mouth daily.     [provider]  sertraline (ZOLOFT) 50 MG tablet TK 1/2 T PO QD FOR 6 DAYS THEN TK 1 T PO D 06/24/18   [provider]    Allergies    Bactrim [sulfamethoxazole-trimethoprim] and Doxycycline  Review of Systems   Review of Systems  Constitutional: Positive for chills, fatigue and fever. Negative for diaphoresis.  HENT: Positive for congestion, rhinorrhea, sneezing and sore throat.   Eyes: Negative for visual disturbance.  Respiratory: Positive for cough, chest tightness and shortness of breath. Negative for choking, wheezing and stridor.   Cardiovascular: Negative for chest pain, palpitations and leg swelling.  Gastrointestinal: Negative for abdominal pain, constipation, diarrhea, nausea and vomiting.  Genitourinary: Negative for dysuria and flank pain.  Musculoskeletal: Positive for myalgias. Negative for back pain, neck pain and neck stiffness.  Neurological: Negative for dizziness, weakness, light-headedness and headaches.  Psychiatric/Behavioral: Negative for agitation.  All other systems reviewed and are negative.   Physical Exam Updated Vital Signs BP 121/72   Pulse 100   Temp 99 F (37.2 C) (Oral)   Resp 18   SpO2 99%   Physical Exam Vitals and nursing note reviewed.  Constitutional:      General: She is not in acute distress.    Appearance: She is well-developed. She is not ill-appearing, toxic-appearing or diaphoretic.  HENT:     Head: Normocephalic and atraumatic.     Nose: Congestion and rhinorrhea present.     Mouth/Throat:     Mouth: Mucous membranes are moist.     Pharynx: No oropharyngeal exudate or posterior oropharyngeal erythema.  Eyes:     Extraocular Movements: Extraocular movements intact.      Conjunctiva/sclera: Conjunctivae normal.     Pupils: Pupils are equal, round, and reactive to light.  Cardiovascular:     Rate and Rhythm: Normal rate and regular rhythm.     Heart sounds: Normal heart sounds. No murmur heard.   Pulmonary:     Effort: Pulmonary effort is normal. No respiratory distress.     Breath sounds: Normal breath sounds. No stridor. No wheezing, rhonchi or rales.  Chest:     Chest wall: No tenderness.  Abdominal:     General: Abdomen is flat.     Palpations: Abdomen is soft.     Tenderness: There is no abdominal tenderness. There is no right CVA tenderness or left CVA tenderness.  Musculoskeletal:        General: No tenderness.     Cervical back: Normal range of motion and neck supple.     Right lower leg: No edema.     Left lower leg: No edema.  Skin:    General: Skin is warm and dry.     Capillary Refill: Capillary refill takes less than 2 seconds.     Findings: No erythema.  Neurological:     General: No focal deficit present.     Mental Status: She is alert.  Psychiatric:        Mood and Affect: Mood normal.     ED Results / Procedures / Treatments   Labs (all labs ordered are listed, but only abnormal results are displayed) Labs Reviewed  CBC WITH DIFFERENTIAL/PLATELET - Abnormal; Notable for the following components:      Result Value   Hemoglobin 11.3 (*)    MCV 78.6 (*)    MCH 24.5 (*)    All other components within normal limits  COMPREHENSIVE METABOLIC PANEL - Abnormal; Notable for the following components:   AST 14 (*)    All other components within normal limits  SARS CORONAVIRUS 2 BY RT PCR [facility] ORDER, [place] [facility])    EKG EKG Interpretation  Date/Time:  Wednesday October 12 2019 20:29:16 EDT Ventricular Rate:  83 PR Interval:    QRS Duration: 92 QT Interval:  361 QTC Calculation: 425 R Axis:   46 Text Interpretation: Sinus rhythm Consider left atrial enlargement When compared to prior, no  significant changes seen. No STEMI Confirmed by Antony Blackbird [id number]) on 10/12/2019 9:16:57 PM   Radiology DG Chest Portable 1 View  Result Date: 10/12/2019 CLINICAL DATA:  SOB, chest tightness, asthma, covid exposure with cough. HX: Asthma, nonsmoker. EXAM: PORTABLE CHEST 1 VIEW COMPARISON:  03/19/2013 FINDINGS: The heart size and mediastinal contours are within normal limits. Both lungs are clear. The visualized skeletal structures are unremarkable. IMPRESSION: No active disease. Electronically  Signed   By: Gaylyn Rong M.D.   On: 10/12/2019 20:55    Procedures Procedures (including critical care time)  Medications Ordered in ED Medications - No data to display  ED Course  I have reviewed the triage vital signs and the nursing notes.  Pertinent labs & imaging results that were available during my care of the patient were reviewed by me and considered in my medical decision making (see chart for details).    MDM Rules/Calculators/A&P                          HERTA HINK is a 34 y.o. female with a past medical history significant for asthma who presents with subjective fevers, chills, body aches, cough, fatigue, malaise, loss of taste and smell, and sore throat.  Patient reports that her mother was recently diagnosed with Covid this past week and the patient has been traveling with her closely over the last few days.  They report they just got back from [place] where they went to a funeral.  The patient also has 2 children who have developed fevers and having cough and are concerned may have coronavirus.  Has not yet been tested but is concerned about Covid.  She denies any chest pain but reports some shortness of breath.  She otherwise denies diarrhea, nausea, or vomiting.  She denies other complaints.  On exam, lungs are clear and chest is nontender.  Abdomen is nontender.  Patient on room air with reassuring vital signs on arrival.  She is nonfebrile and is technically not  tachycardic.  Clinically I have a very high suspicion for COVID-19 infection.  Given her history of asthma with the shortness of breath, we will get a chest x-ray and some screening labs.  Chest x-ray shows no pneumonia and labs were reassuring.  Her coronavirus test was negative however I still have a very high clinical suspicion for COVID-19 infection.  Patient will be instructed to treat herself as if she is Covid positive and stay out of work.  She will self isolate and will stay hydrated.  She will follow-up with a PCP and if symptoms persist or worsen, she will follow-up and get retested.  She did not have pneumonia and we feel she is a for discharge home given her reassuring vital signs however she understands strict return precautions.  Patient discharged with diagnosis of URI and close Covid exposure.  Patient discharged in good condition.   Final Clinical Impression(s) / ED Diagnoses Final diagnoses:  Upper respiratory tract infection, unspecified type  Cough  Shortness of breath  Close exposure to COVID-19 virus    Rx / DC Orders ED Discharge Orders    None      Clinical Impression: 1. Upper respiratory tract infection, unspecified type   2. Cough   3. Shortness of breath   4. Close exposure to COVID-19 virus     Disposition: Discharge  Condition: Good  I have discussed the results, Dx and Tx plan with the pt(& family if present). He/she/they expressed understanding and agree(s) with the plan. Discharge instructions discussed at great length. Strict return precautions discussed and pt &/or family have verbalized understanding of the instructions. No further questions at time of discharge.    Discharge Medication List as of 10/12/2019 11:05 PM      Follow Up: [facility] [address] [place] [address] [phone] Schedule an appointment as soon  as possible for a visit    [facility] [facility]  DEPARTMENT [address] [phone] mc [address] [place] [address] [phone]       Zayden Hahne, Canary Brim, MD 10/12/19 [phone]

## 2019-10-12 NOTE — ED Triage Notes (Signed)
Pt here with loss of taste since Monday, with cough, body aches and sore throat.

## 2019-10-12 NOTE — Discharge Instructions (Signed)
Your work-up today was overall reassuring with a negative chest x-ray, and labs.  Your coronavirus test was negative however given your close exposure and the high clinical concern, I do suspect you have coronavirus despite the negative test.  Please stay isolated, rest, and stay hydrated.  If any symptoms change or worsen, please return to the nearest emergency department.  Please follow-up with your primary doctor.

## 2019-10-18 ENCOUNTER — Encounter (HOSPITAL_BASED_OUTPATIENT_CLINIC_OR_DEPARTMENT_OTHER): Payer: Self-pay | Admitting: Emergency Medicine

## 2019-10-18 ENCOUNTER — Emergency Department (HOSPITAL_BASED_OUTPATIENT_CLINIC_OR_DEPARTMENT_OTHER)
Admission: EM | Admit: 2019-10-18 | Discharge: 2019-10-18 | Disposition: A | Discharge status: Home / Self Care | Payer: BC Managed Care – PPO | Attending: Emergency Medicine | Admitting: Emergency Medicine

## 2019-10-18 ENCOUNTER — Other Ambulatory Visit: Payer: Self-pay

## 2019-10-18 DIAGNOSIS — J069 Acute upper respiratory infection, unspecified: Secondary | ICD-10-CM

## 2019-10-18 DIAGNOSIS — J029 Acute pharyngitis, unspecified: Secondary | ICD-10-CM | POA: Diagnosis not present

## 2019-10-18 DIAGNOSIS — N390 Urinary tract infection, site not specified: Secondary | ICD-10-CM | POA: Insufficient documentation

## 2019-10-18 DIAGNOSIS — J45909 Unspecified asthma, uncomplicated: Secondary | ICD-10-CM | POA: Diagnosis not present

## 2019-10-18 LAB — GROUP A STREP BY PCR: Group A Strep by PCR: NOT DETECTED

## 2019-10-18 MED ORDER — AMOXICILLIN-POT CLAVULANATE 875-125 MG PO TABS: 1.0000 | ORAL_TABLET | Freq: Two times a day (BID) | ORAL | 0 refills | Status: AC

## 2019-10-18 MED FILL — AMOX-CLAV 875-125 MG TABLET: 875-125 | 7 days supply | Qty: 14 | Fill #0

## 2019-10-18 NOTE — Discharge Instructions (Signed)
Please take the antibiotic as prescribed.  Recommend recheck with primary doctor.  If you develop difficulty in breathing, fever, inability to swallow fluids, vomiting or other new concerning symptom, recommend return to ER for reassessment.

## 2019-10-18 NOTE — ED Triage Notes (Signed)
Pt states she was seen here on the 16th for sore throat, cough, and body aches  Pt states she tested negative for covid  Pt states her sxs continue but now her throat feels like it is closing up, her ears hurt, and her lymph nodes are swollen in her neck

## 2019-10-18 NOTE — ED Provider Notes (Signed)
[facility] EMERGENCY DEPARTMENT Provider Note   CSN: [id number] Arrival date & time: 10/18/19  1660     History Chief Complaint  Patient presents with  . Sore Throat  . Cough    Monica Gamble is a 34 y.o. female.  I presents to ER with concern for sore throat, cough, body aches.  Patient reports initially primary symptoms were cough, body aches.  Went to ER on the 16th and was tested for Covid, negative.  States that since that time she has developed sore throat, says that she has had some pain with swallowing, tolerating liquids without any difficulty.  No neck swelling or neck stiffness.  Had a fever last time she came to ER but has not had subsequent fevers.  Also has fullness sensation in bilateral ears.  Denies any chronic medical conditions.  HPI     Past Medical History:  Diagnosis Date  . Anemia   . Anxiety   . Asthma   . Encounter for artificial insemination 10/31/2017   [facility],  Trigger shot 10/29/17  . Palpitations     Patient Active Problem List   Diagnosis Date Noted  . Supervision of normal first pregnancy, antepartum 12/22/2017  . Subchorionic hematoma in first trimester 12/22/2017    Past Surgical History:  Procedure Laterality Date  . CESAREAN SECTION    . NO PAST SURGERIES       OB History    Gravida  1   Para      Term      Preterm      AB      Living        SAB      TAB      Ectopic      Multiple      Live Births  0           Family History  Problem Relation Age of Onset  . CAD Mother   . Kidney failure Father     Social History   Tobacco Use  . Smoking status: Never Smoker  . Smokeless tobacco: Never Used  Vaping Use  . Vaping Use: Never used  Substance Use Topics  . Alcohol use: No  . Drug use: No    Home Medications Prior to Admission medications   Medication Sig Start Date End Date Taking? Authorizing Provider  Acidophilus Lactobacillus CAPS Take 1 tablet by mouth daily as  needed. 12/22/17   Darlina Rumpf, CNM  amoxicillin-clavulanate (AUGMENTIN) 875-125 MG tablet Take 1 tablet by mouth 2 (two) times daily for 7 days. 10/18/19 10/25/19  Lucrezia Starch, MD  clonazePAM Bobbye [place]) 0.5 MG tablet  06/24/18   [provider]  metroNIDAZOLE (FLAGYL) 500 MG tablet Take 1 tablet (500 mg total) by mouth 2 (two) times daily. 12/22/17   Darlina Rumpf, CNM  Prenatal Multivit-Min-Fe-FA (PRENATAL VITAMINS PO) Take 1 tablet by mouth daily.     [provider]  sertraline (ZOLOFT) 50 MG tablet TK 1/2 T PO QD FOR 6 DAYS THEN TK 1 T PO D 06/24/18   [provider]    Allergies    Bactrim [sulfamethoxazole-trimethoprim] and Doxycycline  Review of Systems   Review of Systems  Constitutional: Positive for chills and fatigue. Negative for fever.  HENT: Positive for ear pain and sore throat.   Eyes: Negative for pain and visual disturbance.  Respiratory: Negative for cough and shortness of breath.   Cardiovascular: Negative for chest pain and  palpitations.  Gastrointestinal: Negative for abdominal pain and vomiting.  Genitourinary: Negative for dysuria and hematuria.  Musculoskeletal: Negative for arthralgias and back pain.  Skin: Negative for color change and rash.  Neurological: Negative for seizures and syncope.  All other systems reviewed and are negative.   Physical Exam Updated Vital Signs BP 121/81 (BP Location: Right Arm)   Pulse 94   Temp 98.8 F (37.1 C) (Oral)   Resp 18   Ht 5\' 3"  (1.6 m)   Wt 93 kg   SpO2 95%   BMI 36.31 kg/m   Physical Exam Vitals and nursing note reviewed.  Constitutional:      General: She is not in acute distress.    Appearance: She is well-developed.  HENT:     Head: Normocephalic and atraumatic.     Right Ear: Tympanic membrane normal.     Left Ear: Tympanic membrane normal.     Mouth/Throat:     Mouth: Mucous membranes are moist.     Comments: Posterior oropharynx has some erythema, no  exudates, no tonsillar enlargement Eyes:     Conjunctiva/sclera: Conjunctivae normal.  Cardiovascular:     Rate and Rhythm: Normal rate and regular rhythm.     Heart sounds: No murmur heard.   Pulmonary:     Effort: Pulmonary effort is normal. No respiratory distress.     Breath sounds: Normal breath sounds.  Abdominal:     Palpations: Abdomen is soft.     Tenderness: There is no abdominal tenderness.  Musculoskeletal:     Cervical back: Neck supple.  Skin:    General: Skin is warm and dry.  Neurological:     General: No focal deficit present.     Mental Status: She is alert and oriented to person, place, and time.     ED Results / Procedures / Treatments   Labs (all labs ordered are listed, but only abnormal results are displayed) Labs Reviewed  GROUP A STREP BY PCR    EKG None  Radiology No results found.  Procedures Procedures (including critical care time)  Medications Ordered in ED Medications - No data to display  ED Course  I have reviewed the triage vital signs and the nursing notes.  Pertinent labs & imaging results that were available during my care of the patient were reviewed by me and considered in my medical decision making (see chart for details).    MDM Rules/Calculators/A&P                          34 year old lady presenting to ER with ongoing fatigue, body aches, cough, congestion, now with new sore throat.  Patient is overall well-appearing with stable vital signs.  Rapid strep is negative.  Clinically, suspect most likely upper respiratory illness.  Most likely viral in etiology, given duration of symptoms, will cover for possible bacterial causes of sinusitis with course of Augmentin.  Recommend recheck with primary doctor, reviewed return precautions and discharged home.    After the discussed management above, the patient was determined to be safe for discharge.  The patient was in agreement with this plan and all questions regarding  their care were answered.  ED return precautions were discussed and the patient will return to the ED with any significant worsening of condition.    Final Clinical Impression(s) / ED Diagnoses Final diagnoses:  Upper respiratory tract infection, unspecified type    Rx / DC Orders ED Discharge Orders  Ordered    amoxicillin-clavulanate (AUGMENTIN) 875-125 MG tablet  2 times daily     Discontinue  Reprint     10/18/19 0354           Milagros Loll, MD 10/18/19 [phone]

## 2019-10-26 DIAGNOSIS — J40 Bronchitis, not specified as acute or chronic: Secondary | ICD-10-CM | POA: Diagnosis not present

## 2019-10-26 DIAGNOSIS — R05 Cough: Secondary | ICD-10-CM | POA: Diagnosis not present

## 2019-10-26 DIAGNOSIS — Z03818 Encounter for observation for suspected exposure to other biological agents ruled out: Secondary | ICD-10-CM | POA: Diagnosis not present

## 2019-10-26 DIAGNOSIS — J019 Acute sinusitis, unspecified: Secondary | ICD-10-CM | POA: Diagnosis not present

## 2019-10-26 DIAGNOSIS — Z8709 Personal history of other diseases of the respiratory system: Secondary | ICD-10-CM | POA: Diagnosis not present

## 2019-12-05 IMAGING — US US TRANSVAGINAL NON-OB
1 series · 14 of 25 positions shown · non-contrast
Comparison: None

CLINICAL DATA: Vaginal bleeding for 1 month



[Series 1: us transvaginal non-ob · 0.21mm/px · 14 of 72 slices shown]
[im 1/72]
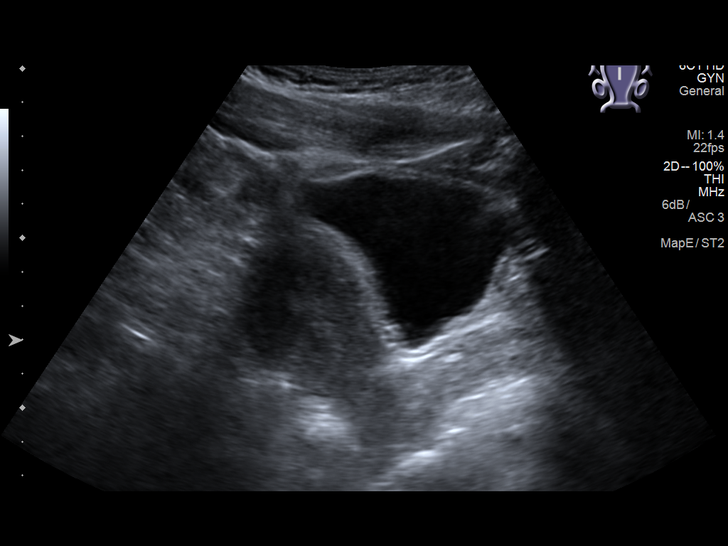
[im 6/72]
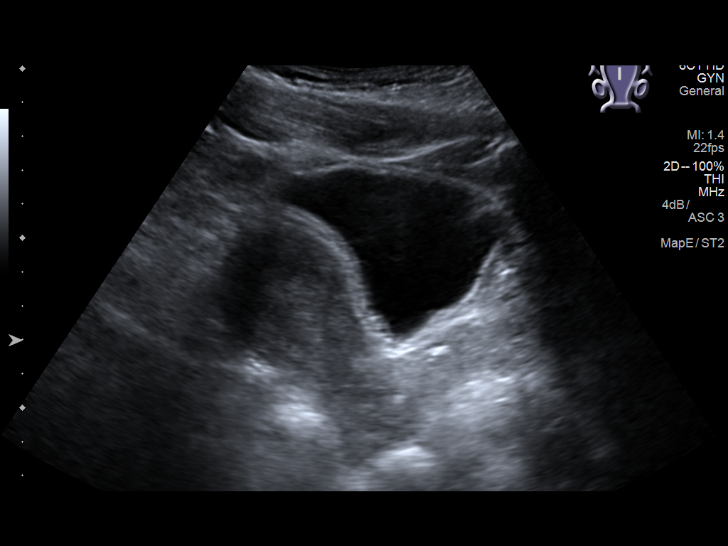
[im 12/72]
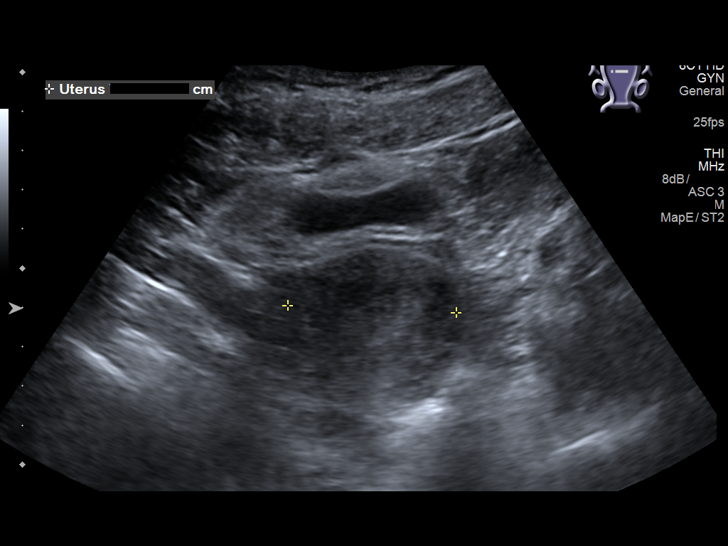
[im 18/72]
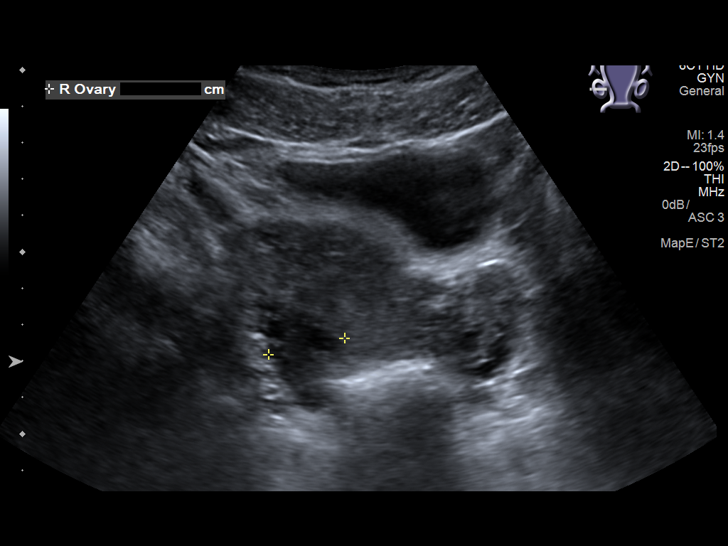
[im 24/72]
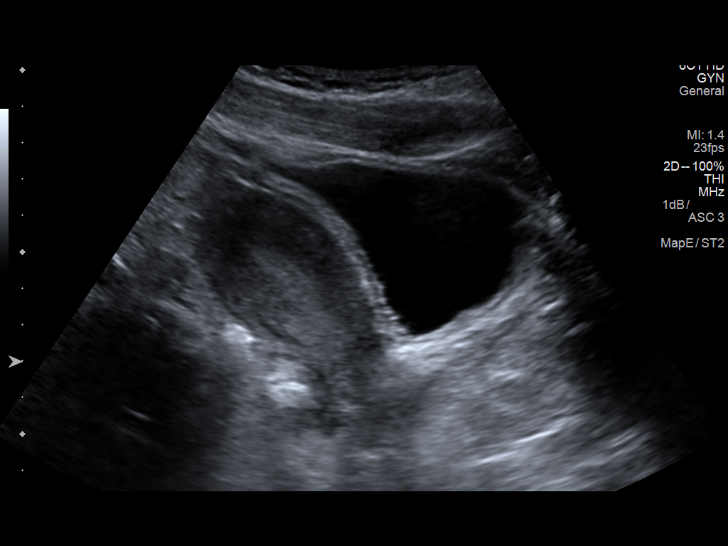
[im 27/72]
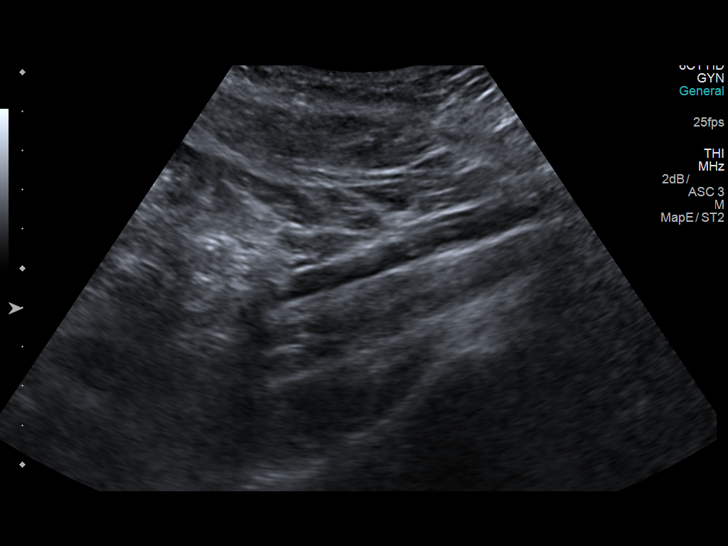
[im 33/72]
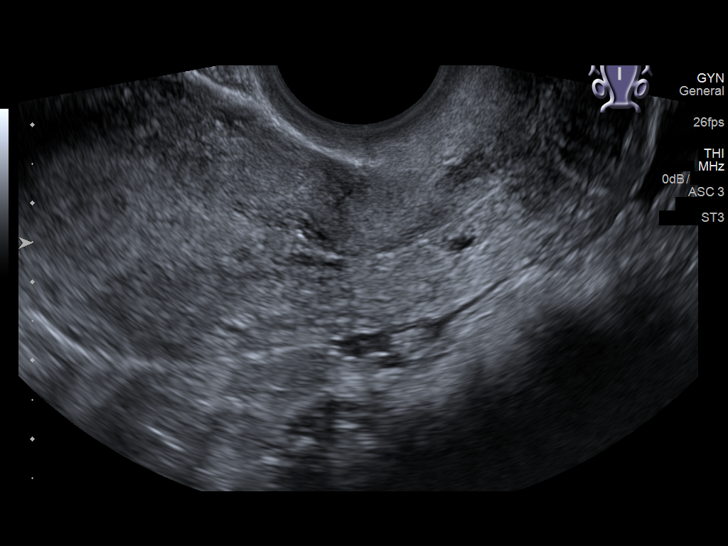
[im 39/72]
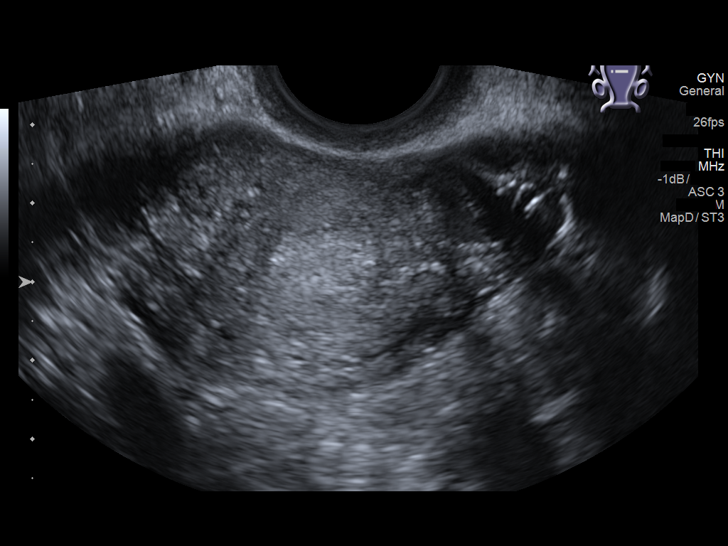
[im 45/72]
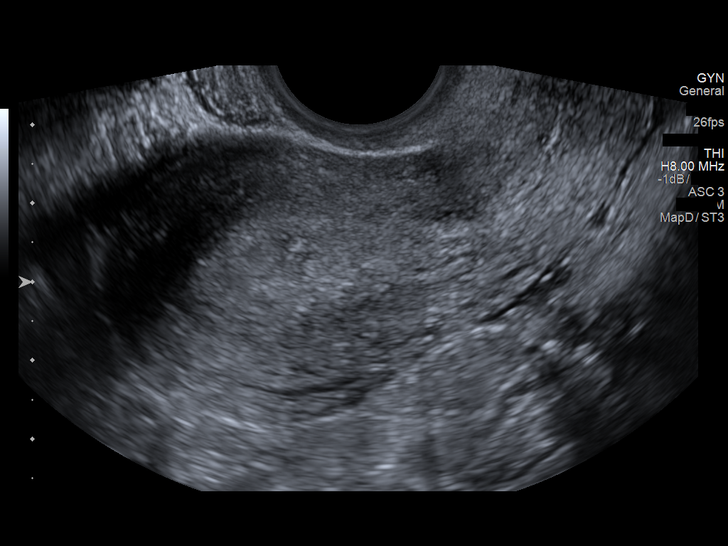
[im 48/72]
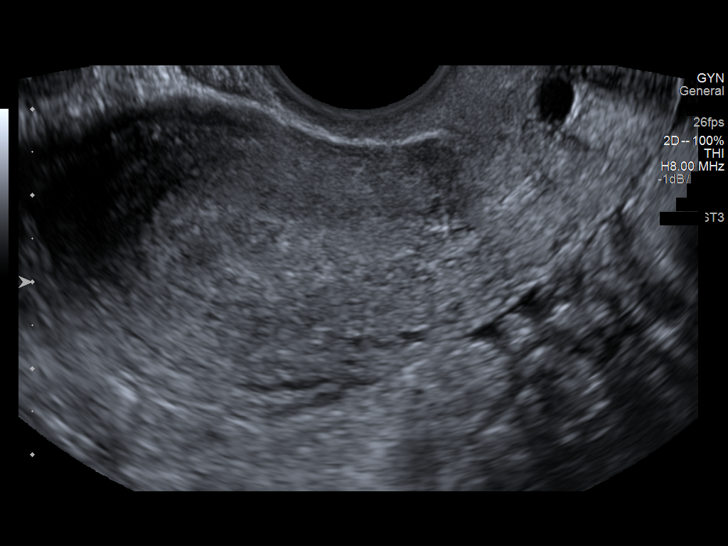
[im 54/72]
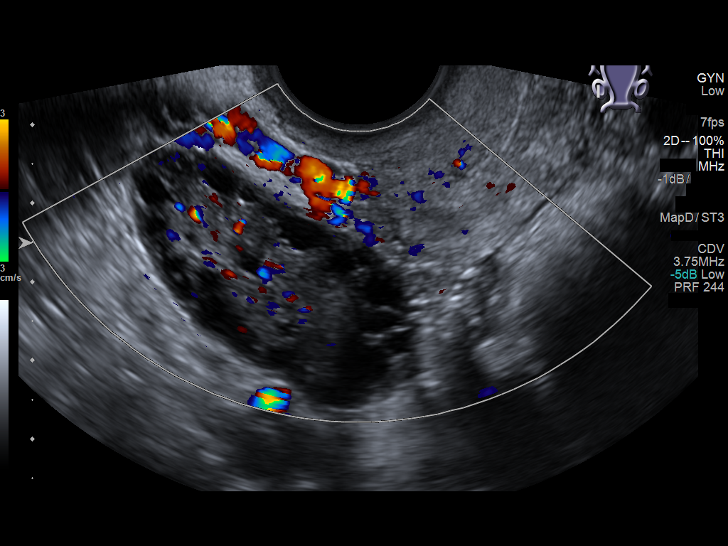
[im 60/72]
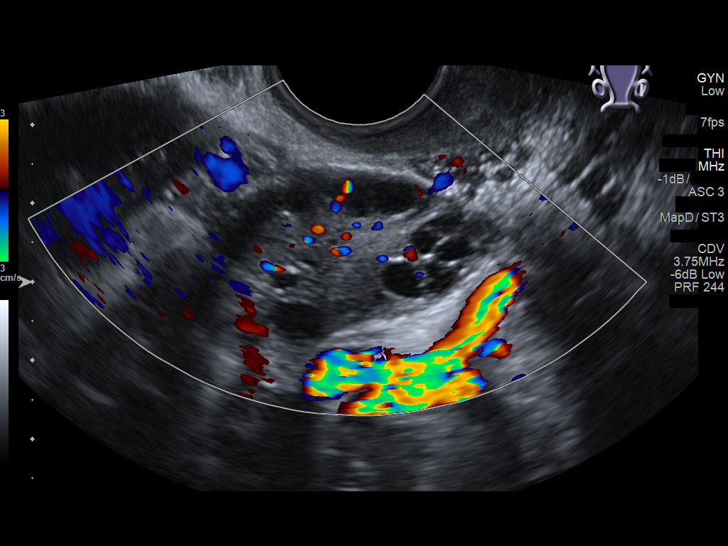
[im 66/72]
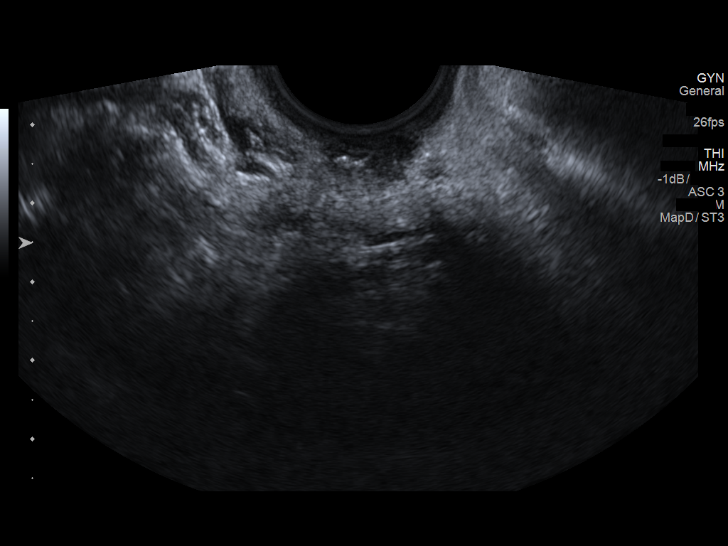
[im 72/72]
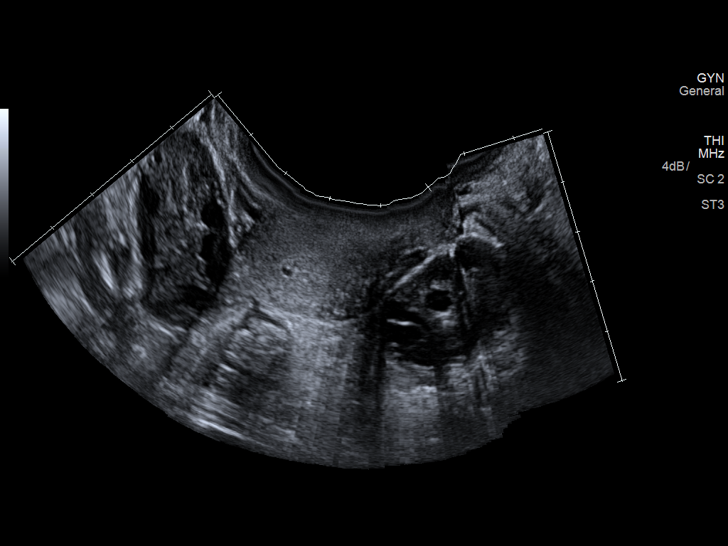

[14 of 25 positions shown; findings below may reference images not displayed]

FINDINGS: Uterus

Measurements: 7.5 x 3.8 x 4.3 cm. No fibroids or other mass
visualized.

Endometrium

Thickness: 12 mm in thickness.  No focal abnormality visualized.

Right ovary

Measurements: 3.9 x 2.2 x 2.8 cm. Normal appearance/no adnexal mass.

Left ovary

Measurements: 3.7 x 2.1 x 2.7 cm. Normal appearance/no adnexal mass.

Other findings

No abnormal free fluid.
IMPRESSION: Unremarkable pelvic ultrasound.

## 2020-02-25 ENCOUNTER — Encounter (HOSPITAL_BASED_OUTPATIENT_CLINIC_OR_DEPARTMENT_OTHER): Payer: Self-pay | Admitting: *Deleted

## 2020-02-25 ENCOUNTER — Other Ambulatory Visit: Payer: Self-pay

## 2020-02-25 ENCOUNTER — Emergency Department (HOSPITAL_BASED_OUTPATIENT_CLINIC_OR_DEPARTMENT_OTHER)
Admission: EM | Admit: 2020-02-25 | Discharge: 2020-02-25 | Disposition: A | Discharge status: Home / Self Care | Payer: BC Managed Care – PPO | Attending: Emergency Medicine | Admitting: Emergency Medicine

## 2020-02-25 DIAGNOSIS — M549 Dorsalgia, unspecified: Secondary | ICD-10-CM

## 2020-02-25 DIAGNOSIS — R202 Paresthesia of skin: Secondary | ICD-10-CM | POA: Insufficient documentation

## 2020-02-25 DIAGNOSIS — M25511 Pain in right shoulder: Secondary | ICD-10-CM | POA: Insufficient documentation

## 2020-02-25 DIAGNOSIS — M546 Pain in thoracic spine: Secondary | ICD-10-CM | POA: Insufficient documentation

## 2020-02-25 DIAGNOSIS — J45909 Unspecified asthma, uncomplicated: Secondary | ICD-10-CM | POA: Insufficient documentation

## 2020-02-25 MED ORDER — METHOCARBAMOL 750 MG PO TABS: 750.0000 mg | ORAL_TABLET | Freq: Two times a day (BID) | ORAL | 0 refills | Status: DC | PRN

## 2020-02-25 MED ORDER — LIDOCAINE 5 % EX PTCH: 1.0000 | MEDICATED_PATCH | CUTANEOUS | 0 refills | Status: DC

## 2020-02-25 NOTE — Discharge Instructions (Addendum)
  Take it easy, but do not lay around too much as this may make any stiffness worse.  Antiinflammatory medications: Take 600 mg of ibuprofen every 6 hours or 440 mg (over the counter dose) to 500 mg (prescription dose) of naproxen every 12 hours for the next 3 days. After this time, these medications may be used as needed for pain. Take these medications with food to avoid upset stomach. Choose only one of these medications, do not take them together. Acetaminophen (generic for Tylenol): Should you continue to have additional pain while taking the ibuprofen or naproxen, you may add in acetaminophen as needed. Your daily total maximum amount of acetaminophen from all sources should be limited to 4000mg/day for persons without liver problems, or 2000mg/day for those with liver problems. Methocarbamol: Methocarbamol (generic for Robaxin) is a muscle relaxer and can help relieve stiff muscles or muscle spasms.  Do not drive or perform other dangerous activities while taking this medication as it can cause drowsiness as well as changes in reaction time and judgement. Lidocaine patches: These are available via either prescription or over-the-counter. The over-the-counter option may be more economical one and are likely just as effective. There are multiple over-the-counter brands, such as Salonpas. Ice: May apply ice to the area over the next 24 hours for 15 minutes at a time to reduce pain, inflammation, and swelling, if present. Exercises: Be sure to perform the attached exercises starting with three times a week and working up to performing them daily. This is an essential part of preventing long term problems.  Follow up: Follow up with a primary care provider for any future management of these complaints. Be sure to follow up within 7-10 days. Return: Return to the ED should symptoms worsen.  For prescription assistance, may try using prescription discount sites or apps, such as goodrx.com 

## 2020-02-25 NOTE — ED Provider Notes (Signed)
[facility] EMERGENCY DEPARTMENT Provider Note   CSN: [id number] Arrival date & time: 02/25/20  1644     History Chief Complaint  Patient presents with  . Rt shoulder pain    Monica Gamble is a 34 y.o. female.  HPI     Monica Gamble is a 34 y.o. female, with a history of anemia, asthma, presenting to the ED with right upper back pain for the past couple days. Pain is in the right upper back and trapezius area, soreness, tightness, spasms, nonradiating from these locations, moderate to severe.  Certain movements of the right shoulder will cause spasms in the back musculature.  She also has tingling. 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  Denies fever, numbness, weakness, chest pain, shortness of breath, trauma, or any other complaints.    Past Medical History:  Diagnosis Date  . Anemia   . Anxiety   . Asthma   . Encounter for artificial insemination 10/31/2017   [facility],  Trigger shot 10/29/17  . Palpitations     Patient Active Problem List   Diagnosis Date Noted  . Supervision of normal first pregnancy, antepartum 12/22/2017  . Subchorionic hematoma in first trimester 12/22/2017    Past Surgical History:  Procedure Laterality Date  . CESAREAN SECTION    . NO PAST SURGERIES       OB History    Gravida  1   Para      Term      Preterm      AB      Living        SAB      TAB      Ectopic      Multiple      Live Births  0           Family History  Problem Relation Age of Onset  . CAD Mother   . Kidney failure Father     Social History   Tobacco Use  . Smoking status: Never Smoker  . Smokeless tobacco: Never Used  Vaping Use  . Vaping Use: Never used  Substance Use Topics  . Alcohol use: No  . Drug use: No    Home Medications Prior to Admission medications   Medication Sig Start Date End Date Taking? Authorizing Provider  Acidophilus Lactobacillus  CAPS Take 1 tablet by mouth daily as needed. 12/22/17   Calvert Cantor, CNM  clonazePAM (KLONOPIN) 0.5 MG tablet  06/24/18   [provider]  lidocaine (LIDODERM) 5 % Place 1 patch onto the skin daily. Remove & Discard patch within 12 hours or as directed by MD 02/25/20   Marico Buckle C, PA-C  methocarbamol (ROBAXIN) 750 MG tablet Take 1 tablet (750 mg total) by mouth 2 (two) times daily as needed for muscle spasms (or muscle tightness). 02/25/20   Soham Hollett C, PA-C  metroNIDAZOLE (FLAGYL) 500 MG tablet Take 1 tablet (500 mg total) by mouth 2 (two) times daily. 12/22/17   Calvert Cantor, CNM  Prenatal Multivit-Min-Fe-FA (PRENATAL VITAMINS PO) Take 1 tablet by mouth daily.     [provider]  sertraline (ZOLOFT) 50 MG tablet TK 1/2 T PO QD FOR 6 DAYS THEN TK 1 T PO D 06/24/18   [provider]    Allergies    Bactrim [sulfamethoxazole-trimethoprim] and Doxycycline  Review of Systems   Review of Systems  Constitutional: Negative for fever.  Respiratory: Negative for cough and shortness of breath.   Musculoskeletal: Positive for back pain.  Neurological: Negative for weakness and numbness.    Physical Exam Updated Vital Signs BP 122/82 (BP Location: Left Arm)   Pulse 76   Temp 98.5 F (36.9 C) (Oral)   Resp 18   Ht 5\' 3"  (1.6 m)   Wt 95.3 kg   SpO2 100%   BMI 37.20 kg/m   Physical Exam Vitals and nursing note reviewed.  Constitutional:      General: She is not in acute distress.    Appearance: She is well-developed. She is not diaphoretic.  HENT:     Head: Normocephalic and atraumatic.  Eyes:     Conjunctiva/sclera: Conjunctivae normal.  Cardiovascular:     Rate and Rhythm: Normal rate and regular rhythm.     Pulses:          Radial pulses are 2+ on the right side.  Pulmonary:     Effort: Pulmonary effort is normal.  Musculoskeletal:     Cervical back: Neck supple.       Back:     Comments: Tenderness to the right trapezius and right  upper back musculature.  No tenderness or pain in the actual shoulder.  Range of motion intact in the right shoulder without noted instability or deformity.  No pain with range of motion of the wrist of the right upper extremity. Normal motor function intact in all extremities. No midline spinal tenderness.   Skin:    General: Skin is warm and dry.     Capillary Refill: Capillary refill takes less than 2 seconds.     Coloration: Skin is not pale.  Neurological:     Mental Status: She is alert.  Psychiatric:        Behavior: Behavior normal.     ED Results / Procedures / Treatments   Labs (all labs ordered are listed, but only abnormal results are displayed) Labs Reviewed - No data to display  EKG None  Radiology No results found.  Procedures Procedures (including critical care time)  Medications Ordered in ED Medications - No data to display  ED Course  I have reviewed the triage vital signs and the nursing notes.  Pertinent labs & imaging results that were available during my care of the patient were reviewed by me and considered in my medical decision making (see chart for details).    MDM Rules/Calculators/A&P                          Patient presents with right upper back pain and spasms.  No evidence of neurovascular compromise.  No traumatic origin to indicate imaging. The patient was given instructions for home care as well as return precautions. Patient voices understanding of these instructions, accepts the plan, and is comfortable with discharge.   Final Clinical Impression(s) / ED Diagnoses Final diagnoses:  Upper back pain on right side    Rx / DC Orders ED Discharge Orders         Ordered    methocarbamol (ROBAXIN) 750 MG tablet  2 times daily PRN        02/25/20 1722    lidocaine (LIDODERM) 5 %  Every 24 hours        02/25/20 1722           02/27/20, PA-C 02/25/20 1728    02/27/20, DO 02/25/20 1823

## 2020-02-25 NOTE — ED Triage Notes (Signed)
Presents with RIGHT SIDE discomfort, feels like she said a spasm feeling in the Right shoulder neck area, onset approx 2 days ago. 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

## 2020-02-28 IMAGING — US US OB TRANSVAGINAL
1 series · 15 of 28 positions shown · non-contrast
Comparison: 11/23/2017

CLINICAL DATA: Pregnancy of inconclusive fetal viability, vaginal
bleeding in first trimester of pregnancy

EXAM:
TRANSVAGINAL OB ULTRASOUND
TECHNIQUE: Transvaginal ultrasound was performed for complete evaluation of the
gestation as well as the maternal uterus, adnexal regions, and
pelvic cul-de-sac.

[Series 1: us ob transvaginal · 15 of 35 slices shown]
[im 1/35]
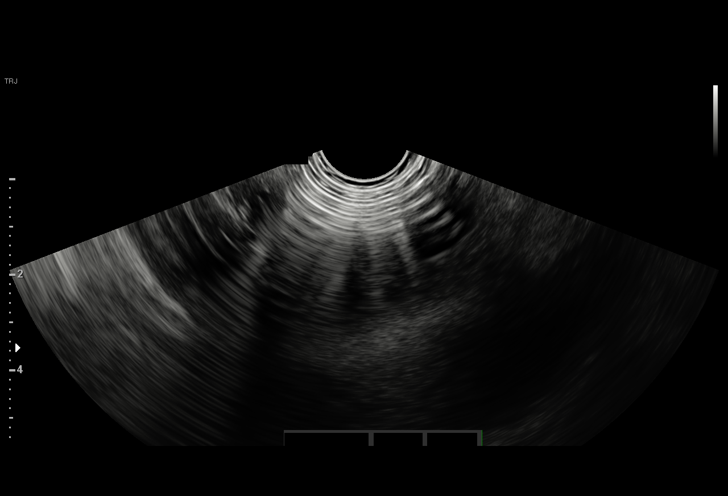
[im 3/35]
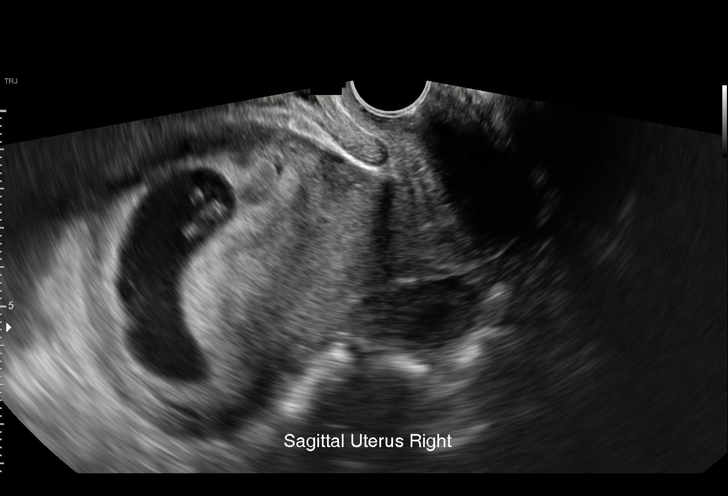
[im 6/35]
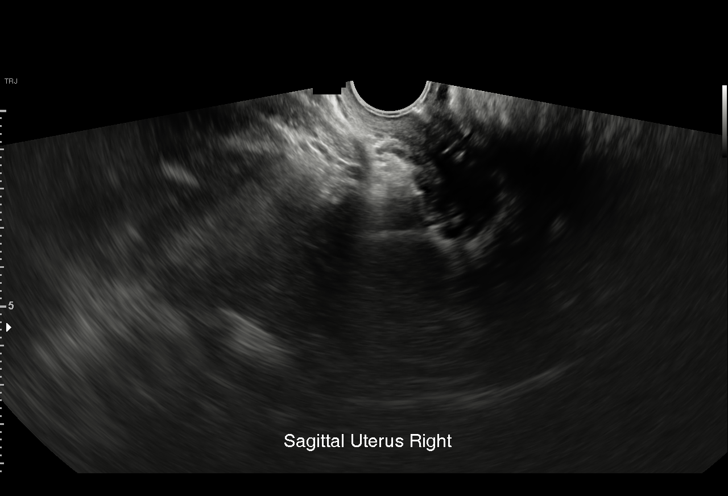
[im 8/35]
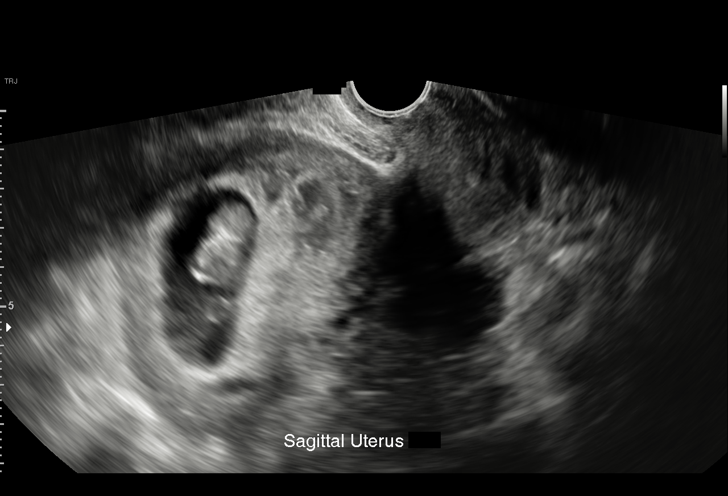
[im 11/35]
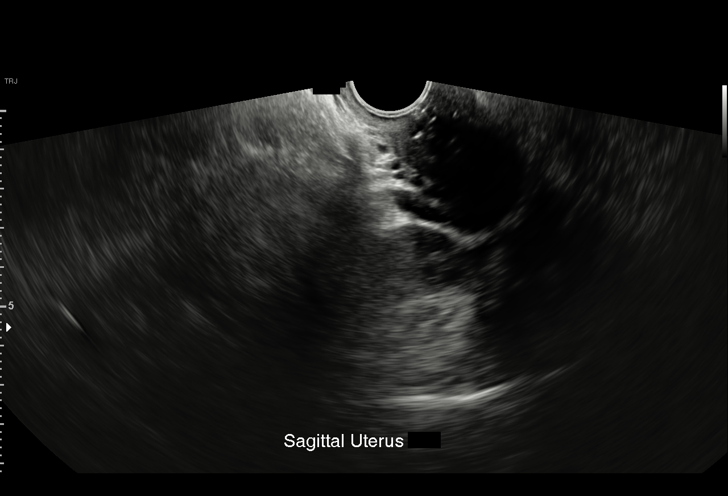
[im 13/35]
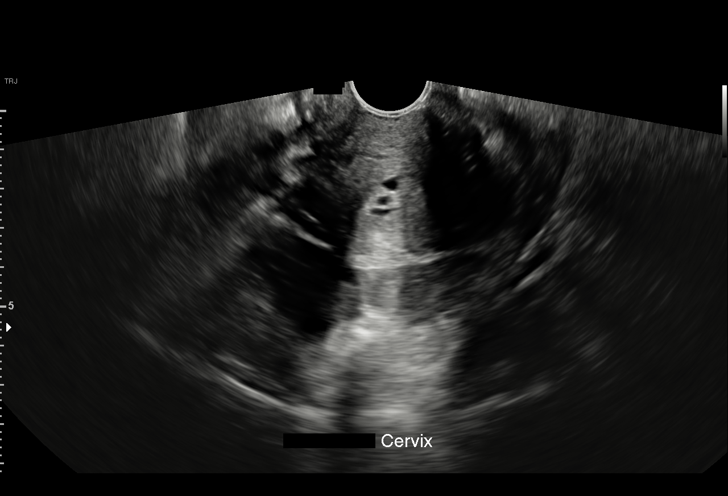
[im 16/35]
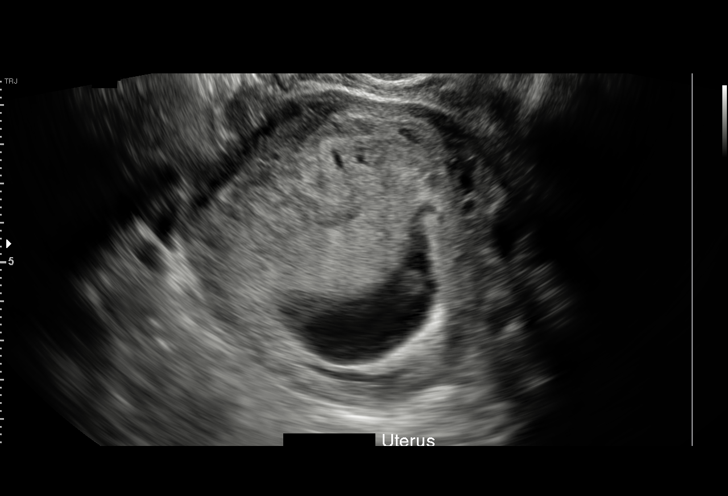
[im 18/35]
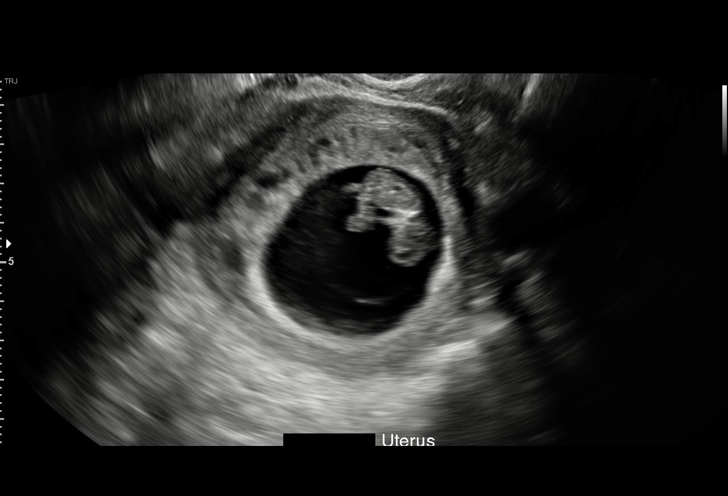
[im 19/35]
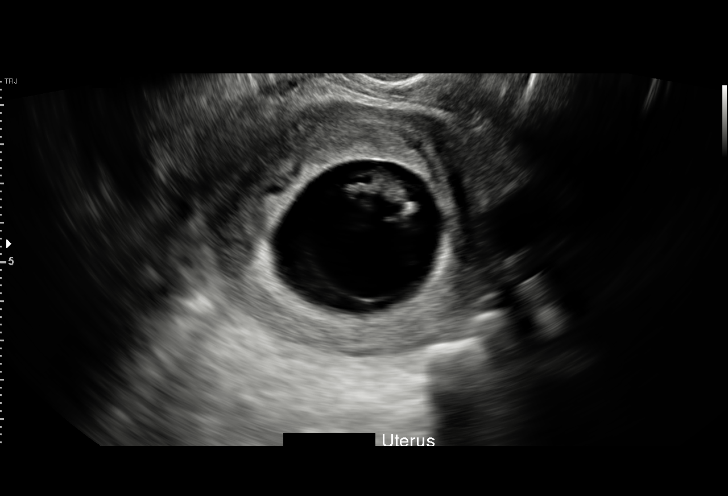
[im 22/35]
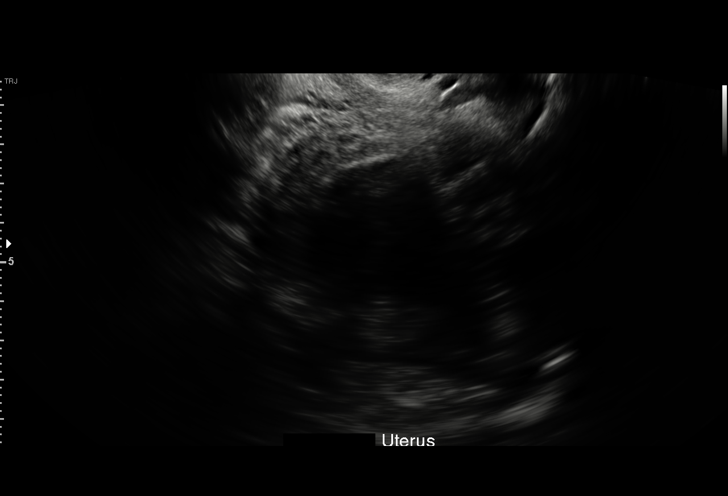
[im 24/35]
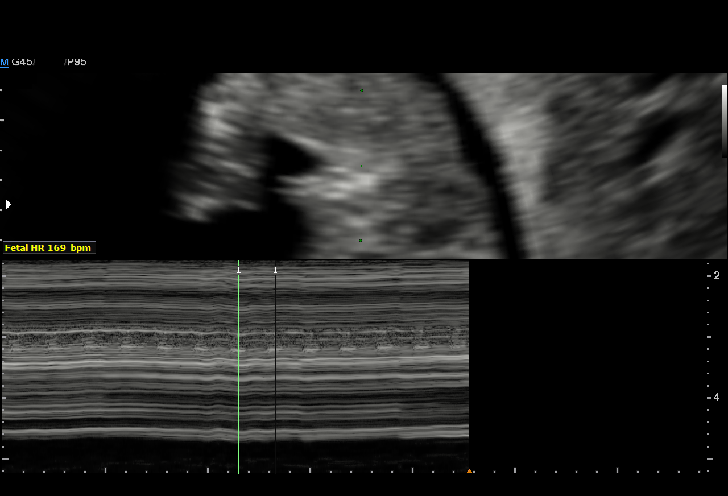
[im 27/35]
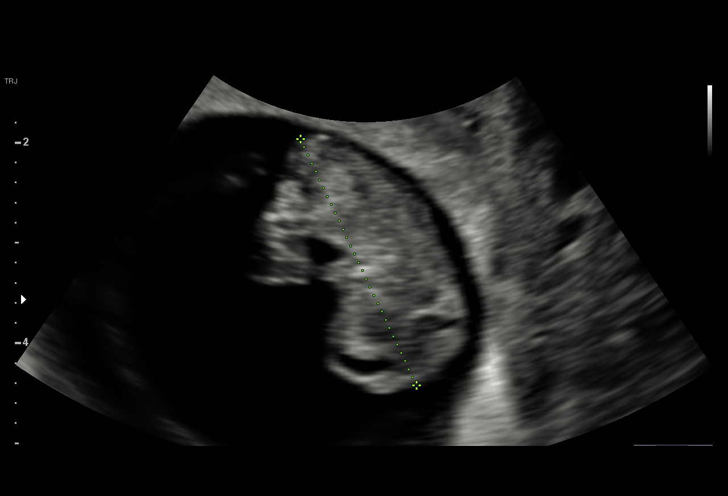
[im 29/35]
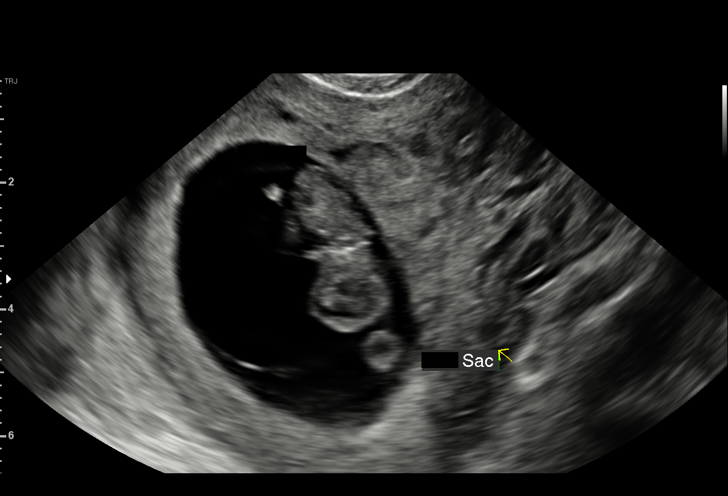
[im 32/35]
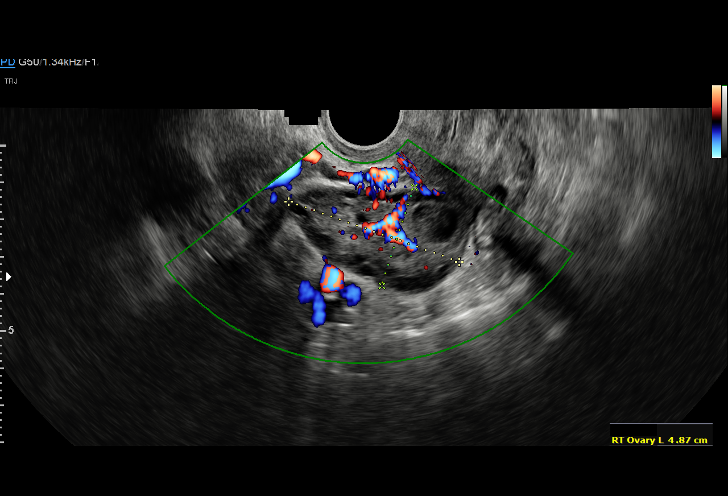
[im 35/35]
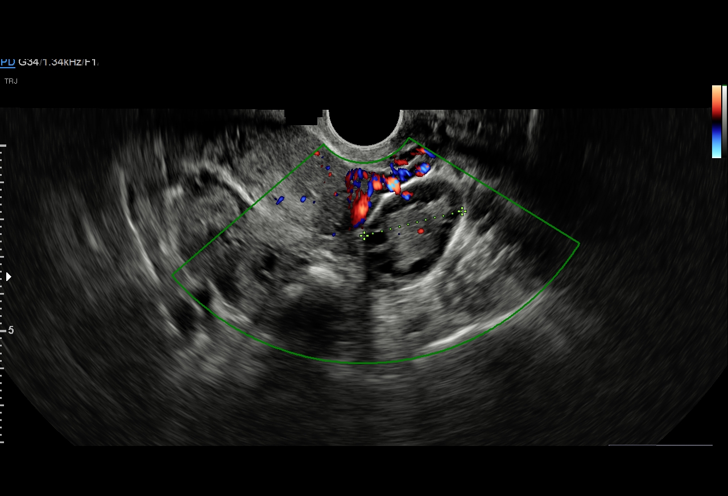

[15 of 28 positions shown; findings below may reference images not displayed]

FINDINGS: Intrauterine gestational sac: Present, single

Yolk sac:  Present

Embryo:  Present

Cardiac Activity: Present

Heart Rate: 169 bpm

CRL:   26.9 mm   9 w 3 d                  US EDC: 07/24/2018

Subchorionic hemorrhage: Small subchorionic hemorrhage present 12 x
18 x 14 mm

Maternal uterus/adnexae:

RIGHT ovary normal size and morphology, 4.9 x 2.8 x 3.2 cm.

LEFT ovary normal size and morphology, 4.2 x 2.5 x 2.7 cm.

No adnexal masses or free pelvic fluid.
IMPRESSION: Single live intrauterine gestation at 9 weeks 3 days EGA by
crown-rump length.

Small subchronic hemorrhage.

## 2020-03-06 ENCOUNTER — Ambulatory Visit: Payer: BC Managed Care – PPO | Admitting: Family Medicine

## 2020-03-06 NOTE — Progress Notes (Deleted)
  Monica Gamble - 34 y.o. female MRN [id number]  Date of birth: [birth year]  SUBJECTIVE:  Including CC & ROS.  No chief complaint on file.   Monica Gamble is a 34 y.o. female that is  ***.  ***   Review of Systems See HPI   HISTORY: Past Medical, Surgical, Social, and Family History Reviewed & Updated per EMR.   Pertinent Historical Findings include:  Past Medical History:  Diagnosis Date  . Anemia   . Anxiety   . Asthma   . Encounter for artificial insemination 10/31/2017   [facility],  Trigger shot 10/29/17  . Palpitations     Past Surgical History:  Procedure Laterality Date  . CESAREAN SECTION    . NO PAST SURGERIES      Family History  Problem Relation Age of Onset  . CAD Mother   . Kidney failure Father     Social History   Socioeconomic History  . Marital status: Married    Spouse name: Not on file  . Number of children: Not on file  . Years of education: Not on file  . Highest education level: Not on file  Occupational History  . Not on file  Tobacco Use  . Smoking status: Never Smoker  . Smokeless tobacco: Never Used  Vaping Use  . Vaping Use: Never used  Substance and Sexual Activity  . Alcohol use: No  . Drug use: No  . Sexual activity: Yes    Birth control/protection: None  Other Topics Concern  . Not on file  Social History Narrative  . Not on file   Social Determinants of Health   Financial Resource Strain:   . Difficulty of Paying Living Expenses: Not on file  Food Insecurity:   . Worried About Programme researcher, broadcasting/film/video in the Last Year: Not on file  . Ran Out of Food in the Last Year: Not on file  Transportation Needs:   . Lack of Transportation (Medical): Not on file  . Lack of Transportation (Non-Medical): Not on file  Physical Activity:   . Days of Exercise per Week: Not on file  . Minutes of Exercise per Session: Not on file  Stress:   . Feeling of Stress : Not on file  Social Connections:   . Frequency of  Communication with Friends and Family: Not on file  . Frequency of Social Gatherings with Friends and Family: Not on file  . Attends Religious Services: Not on file  . Active Member of Clubs or Organizations: Not on file  . Attends Banker Meetings: Not on file  . Marital Status: Not on file  Intimate Partner Violence:   . Fear of Current or Ex-Partner: Not on file  . Emotionally Abused: Not on file  . Physically Abused: Not on file  . Sexually Abused: Not on file     PHYSICAL EXAM:  VS: There were no vitals taken for this visit. Physical Exam Gen: NAD, alert, cooperative with exam, well-appearing MSK:  ***      ASSESSMENT & PLAN:   No problem-specific Assessment & Plan notes found for this encounter.

## 2020-05-23 IMAGING — US US OB COMP LESS 14 WK
1 series · 14 of 28 positions shown · non-contrast
Comparison: None.

CLINICAL DATA: Right-sided abdominal pain, quantitative hCG 8,776

EXAM:
OBSTETRIC <14 WK US AND TRANSVAGINAL OB US
TECHNIQUE: Both transabdominal and transvaginal ultrasound examinations were
performed for complete evaluation of the gestation as well as the
maternal uterus, adnexal regions, and pelvic cul-de-sac.
Transvaginal technique was performed to assess early pregnancy.

[Series 1: us ob comp less 14 wk · 0.22mm/px · 109 acquisitions, 14 frames shown]
[im 5/109]
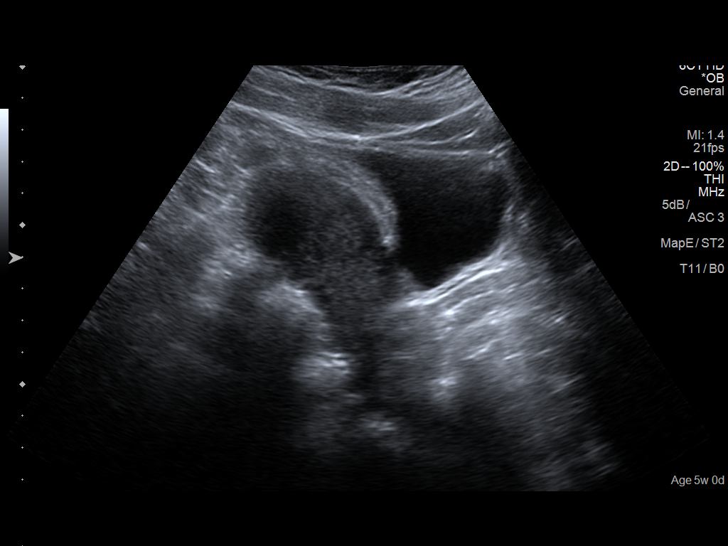
[im 13/109]
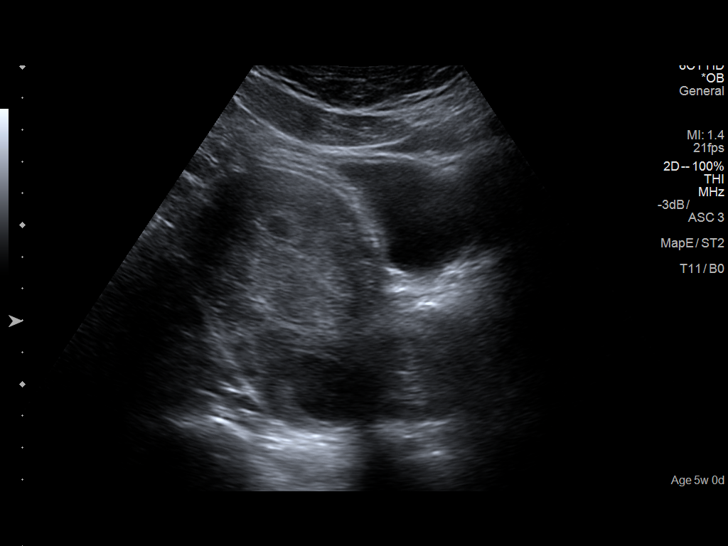
[im 21/109]
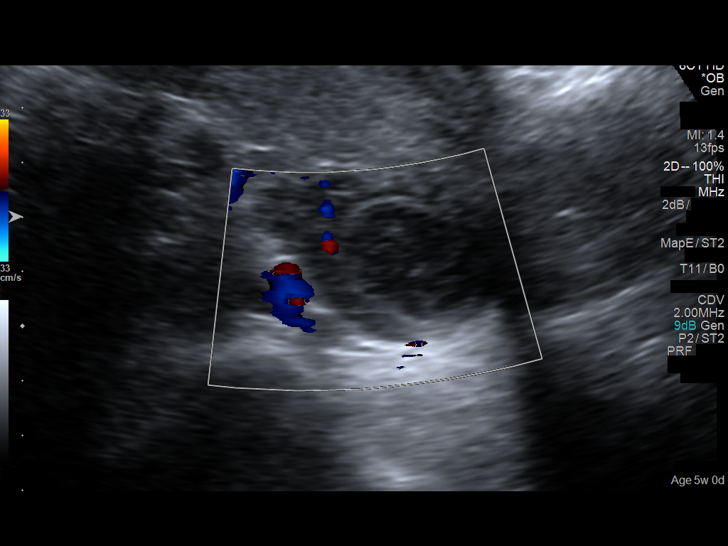
[im 29/109]
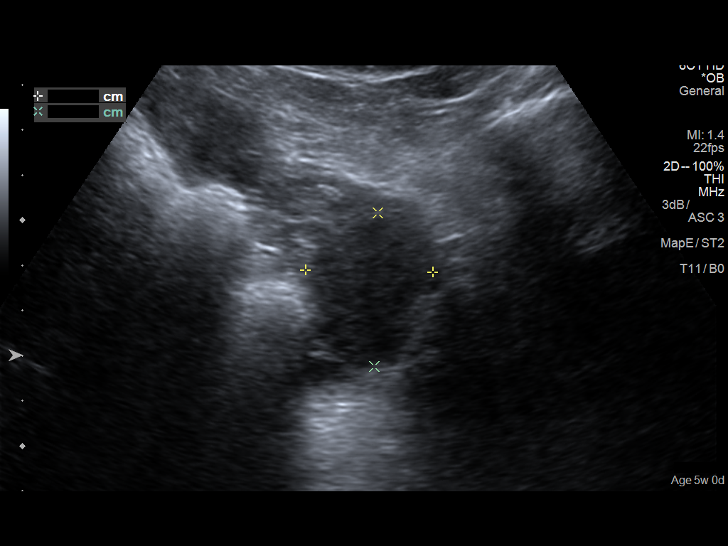
[im 37/109]
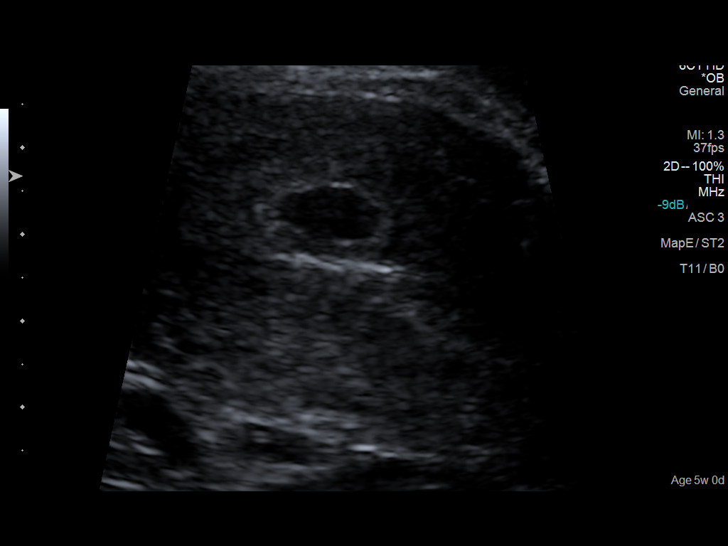
[im 45/109]
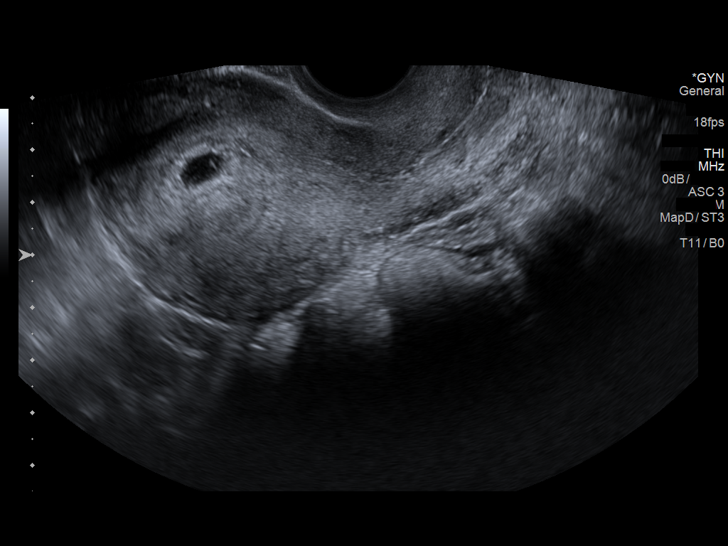
[im 53/109]
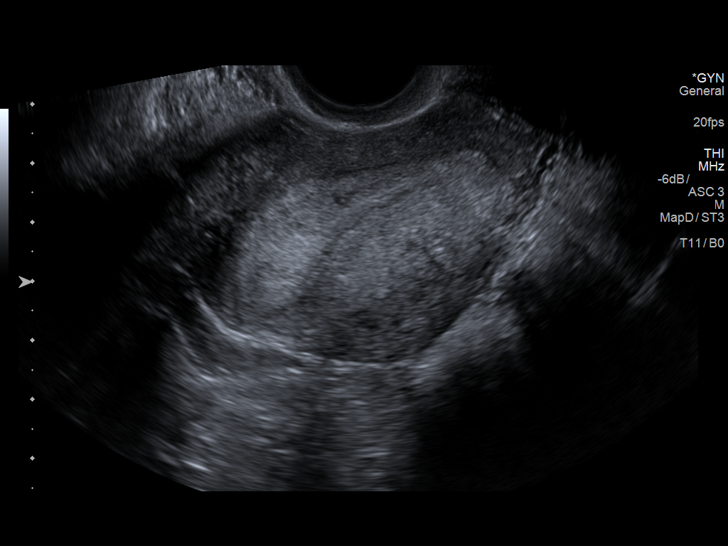
[im 61/109]
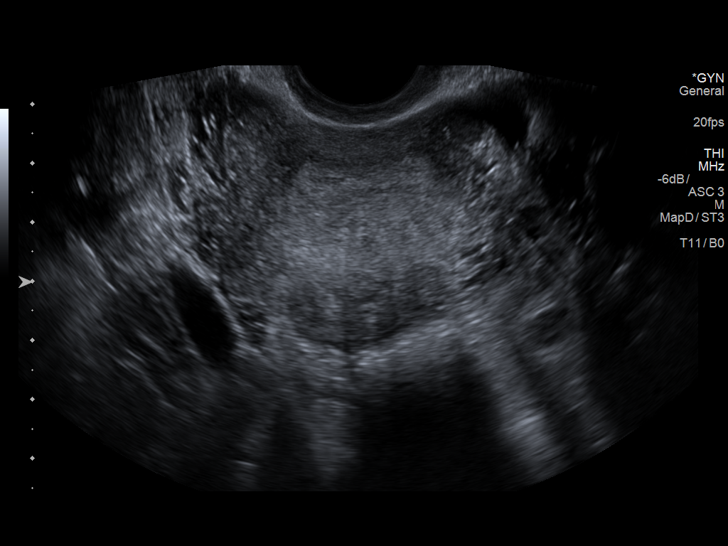
[im 69/109]
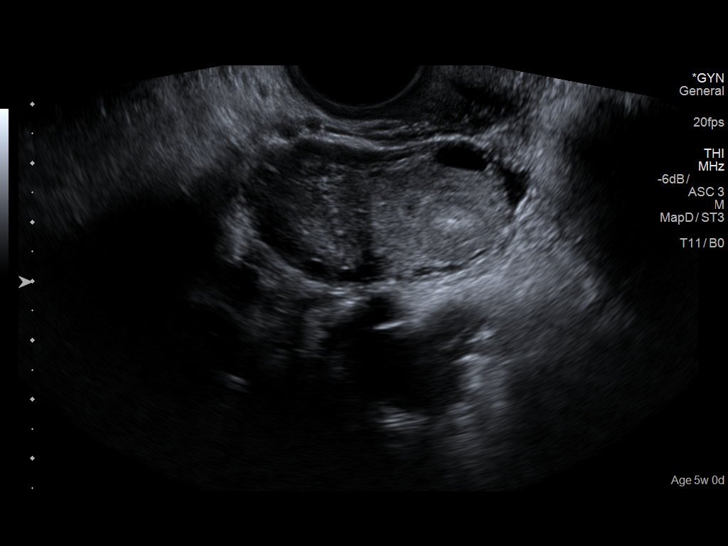
[im 77/109]
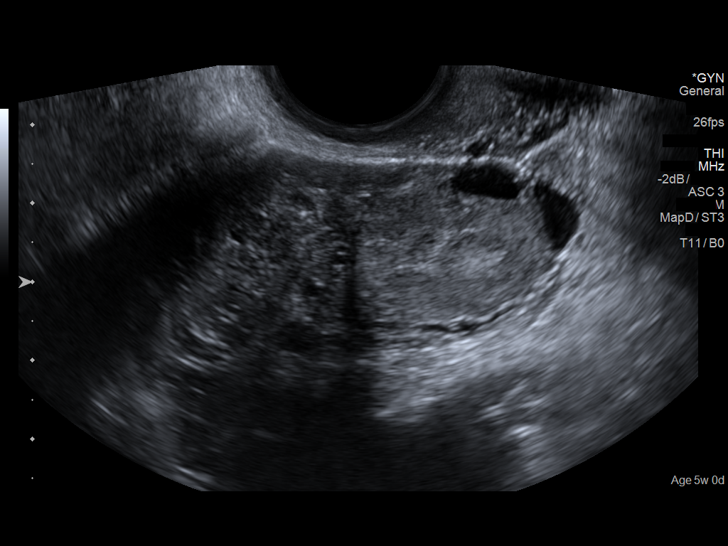
[im 85/109]
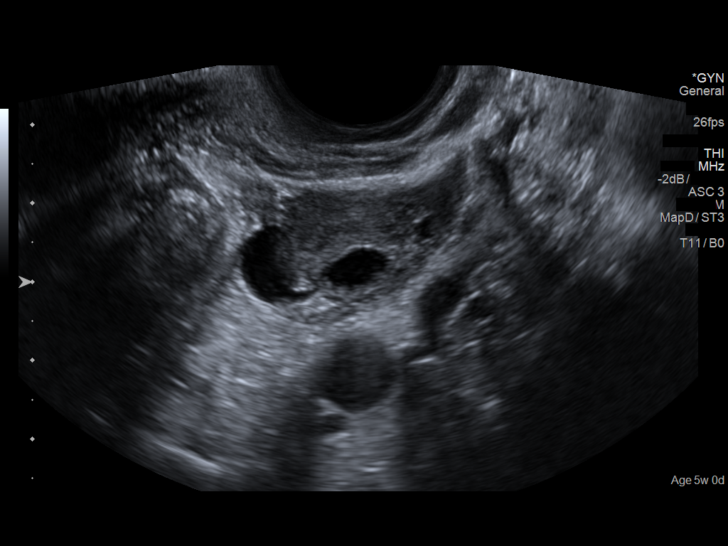
[im 93/109]
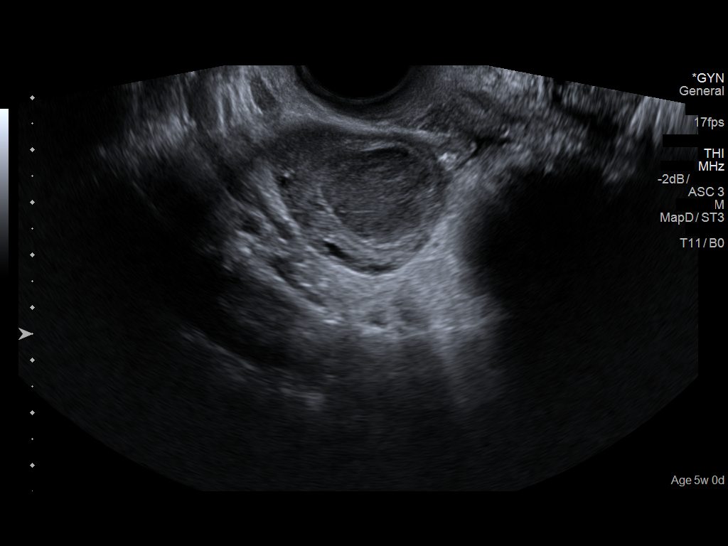
[im 101/109]
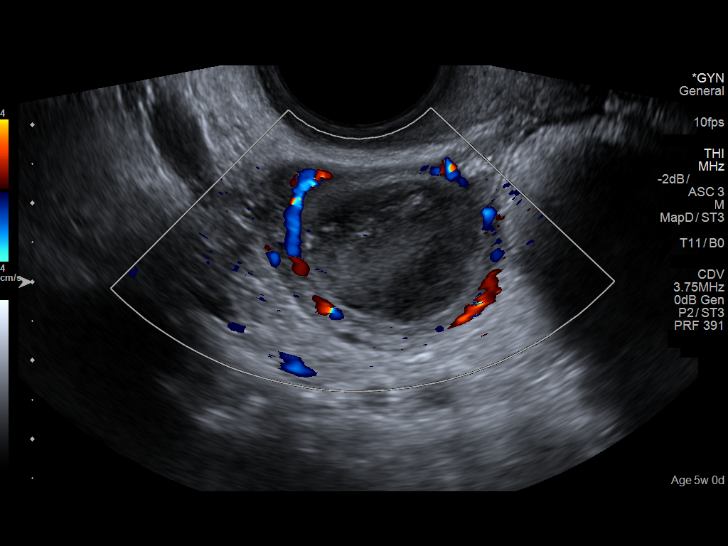
[im 109/109]
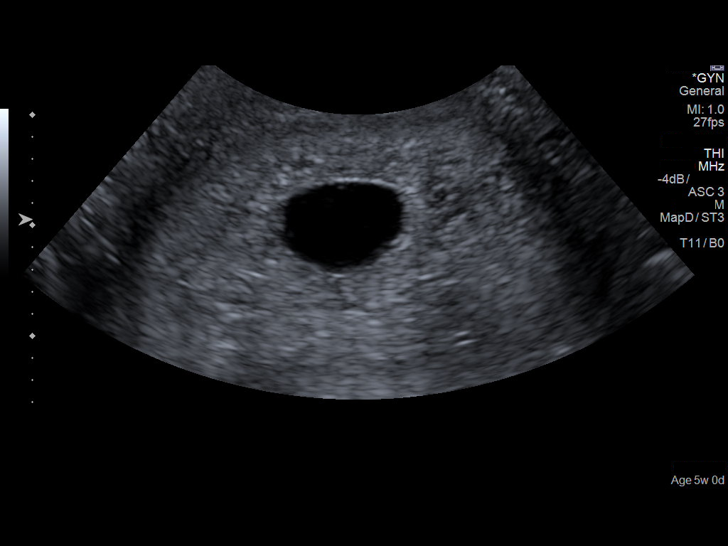

[14 of 28 positions shown; findings below may reference images not displayed]

FINDINGS: Intrauterine gestational sac: Single intrauterine gestational sac

Yolk sac:  Visible

Embryo:  Not visible

MSD: 8.7 mm   5 w   5 d

Subchorionic hemorrhage:  None visualized.

Maternal uterus/adnexae: Right ovary measures 3.2 by 5.4 x 2.8 cm
and contains probable hemorrhagic corpus luteal body measuring
cm. The left ovary measures 3.6 by 4.9 x 2.3 cm. Area of slight
increased echogenicity measuring 2.5 cm may reflect collapsing or
hemorrhagic follicle. Trace free fluid in the pelvis.
IMPRESSION: Single intrauterine gestational sac and yolk sac but no embryo.
Follow-up ultrasound in 10-14 days could be obtained to confirm
viability. Trace amount of free fluid in the pelvis.

## 2020-11-04 DIAGNOSIS — Z20822 Contact with and (suspected) exposure to covid-19: Secondary | ICD-10-CM | POA: Diagnosis not present

## 2020-12-09 ENCOUNTER — Other Ambulatory Visit: Payer: Self-pay

## 2020-12-09 ENCOUNTER — Encounter (HOSPITAL_BASED_OUTPATIENT_CLINIC_OR_DEPARTMENT_OTHER): Payer: Self-pay

## 2020-12-09 ENCOUNTER — Emergency Department (HOSPITAL_BASED_OUTPATIENT_CLINIC_OR_DEPARTMENT_OTHER): Payer: BC Managed Care – PPO

## 2020-12-09 ENCOUNTER — Emergency Department (HOSPITAL_BASED_OUTPATIENT_CLINIC_OR_DEPARTMENT_OTHER)
Admission: EM | Admit: 2020-12-09 | Discharge: 2020-12-09 | Disposition: A | Discharge status: Home / Self Care | Payer: BC Managed Care – PPO | Attending: Emergency Medicine | Admitting: Emergency Medicine

## 2020-12-09 DIAGNOSIS — J45909 Unspecified asthma, uncomplicated: Secondary | ICD-10-CM | POA: Diagnosis not present

## 2020-12-09 DIAGNOSIS — M546 Pain in thoracic spine: Secondary | ICD-10-CM | POA: Diagnosis not present

## 2020-12-09 DIAGNOSIS — Y990 Civilian activity done for income or pay: Secondary | ICD-10-CM | POA: Insufficient documentation

## 2020-12-09 DIAGNOSIS — R0602 Shortness of breath: Secondary | ICD-10-CM | POA: Diagnosis not present

## 2020-12-09 DIAGNOSIS — R079 Chest pain, unspecified: Secondary | ICD-10-CM | POA: Diagnosis not present

## 2020-12-09 DIAGNOSIS — X500XXA Overexertion from strenuous movement or load, initial encounter: Secondary | ICD-10-CM | POA: Diagnosis not present

## 2020-12-09 DIAGNOSIS — R9431 Abnormal electrocardiogram [ECG] [EKG]: Secondary | ICD-10-CM | POA: Diagnosis not present

## 2020-12-09 DIAGNOSIS — M5414 Radiculopathy, thoracic region: Secondary | ICD-10-CM | POA: Insufficient documentation

## 2020-12-09 DIAGNOSIS — M5410 Radiculopathy, site unspecified: Secondary | ICD-10-CM

## 2020-12-09 MED ORDER — METHOCARBAMOL 500 MG PO TABS: 500.0000 mg | ORAL_TABLET | Freq: Two times a day (BID) | ORAL | 0 refills | Status: DC | PRN

## 2020-12-09 MED ORDER — LIDOCAINE 5 % EX PTCH: 1.0000 | MEDICATED_PATCH | CUTANEOUS | 0 refills | Status: AC

## 2020-12-09 MED ORDER — PREDNISONE 10 MG (21) PO TBPK
ORAL_TABLET | Freq: Every day | ORAL | 0 refills | Status: DC
Start: 2020-12-09 — End: 2021-05-21

## 2020-12-09 MED ORDER — PREDNISONE 50 MG PO TABS
60.0000 mg | ORAL_TABLET | Freq: Once | ORAL | Status: AC
  Administered 2020-12-09: 60 mg via ORAL
  Filled 2020-12-09: qty 1

## 2020-12-09 NOTE — ED Provider Notes (Signed)
[facility] EMERGENCY DEPARTMENT Provider Note   CSN: [id number] Arrival date & time: 12/09/20  1047     History Chief Complaint  Patient presents with   Back Pain    Monica Gamble is a 35 y.o. female presenting for evaluation of right-sided back pain.  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  Over the past couple days, she has had gradually worsening pain in her right back/shoulder, now going down to the lower part of her back and to her right arm.  Pain is worse with movement and when she lays on that side.  She also has some mild discomfort in the front of the right shoulder.  She is taken 1 dose of Tylenol and ibuprofen without improvement of symptoms.  She is not taking anything else.  No fall, trauma, or injury.  She denies fevers, chills, cough, left-sided pain, nausea, vomit, abdominal pain, urinary symptoms, normal bowel movements.  She states she is scared to take a deep breath because of the pain, however does not always hurt worse when she does.  No recent travel, surgeries, immobilization, history of cancer, history previous DVT/PE, or hormone use.  HPI     Past Medical History:  Diagnosis Date   Anemia    Anxiety    Asthma    Encounter for artificial insemination 10/31/2017   [facility],  Trigger shot 10/29/17   Palpitations     Patient Active Problem List   Diagnosis Date Noted   Supervision of normal first pregnancy, antepartum 12/22/2017   Subchorionic hematoma in first trimester 12/22/2017    Past Surgical History:  Procedure Laterality Date   CESAREAN SECTION     NO PAST SURGERIES       OB History     Gravida  1   Para      Term      Preterm      AB      Living         SAB      IAB      Ectopic      Multiple      Live Births  0           Family History  Problem Relation Age of Onset   CAD Mother    Kidney failure Father     Social History    Tobacco Use   Smoking status: Never   Smokeless tobacco: Never  Vaping Use   Vaping Use: Never used  Substance Use Topics   Alcohol use: Yes    Comment: 1 glass a day   Drug use: No    Home Medications Prior to Admission medications   Medication Sig Start Date End Date Taking? Authorizing Provider  lidocaine (LIDODERM) 5 % Place 1 patch onto the skin daily. Remove & Discard patch within 12 hours or as directed by MD 12/09/20  Yes Javanni Maring, PA-C  predniSONE (STERAPRED UNI-PAK 21 TAB) 10 MG (21) TBPK tablet Take by mouth daily. Take 6 tabs by mouth daily  for 2 days, then 5 tabs for 2 days, then 4 tabs for 2 days, then 3 tabs for 2 days, 2 tabs for 2 days, then 1 tab by mouth daily for 2 days 12/09/20  Yes Jalene Lacko, PA-C  Acidophilus Lactobacillus CAPS Take 1 tablet by mouth daily as needed. 12/22/17   Calvert Cantor, CNM  clonazePAM (KLONOPIN) 0.5 MG  tablet  06/24/18   [provider]  methocarbamol (ROBAXIN) 500 MG tablet Take 1 tablet (500 mg total) by mouth 2 (two) times daily as needed for muscle spasms. 12/09/20   Trudee Chirino, PA-C  metroNIDAZOLE (FLAGYL) 500 MG tablet Take 1 tablet (500 mg total) by mouth 2 (two) times daily. 12/22/17   Calvert Cantor, CNM  Prenatal Multivit-Min-Fe-FA (PRENATAL VITAMINS PO) Take 1 tablet by mouth daily.     [provider]  sertraline (ZOLOFT) 50 MG tablet TK 1/2 T PO QD FOR 6 DAYS THEN TK 1 T PO D 06/24/18   [provider]    Allergies    Bactrim [sulfamethoxazole-trimethoprim] and Doxycycline  Review of Systems   Review of Systems  Musculoskeletal:  Positive for back pain.  All other systems reviewed and are negative.  Physical Exam Updated Vital Signs BP 139/89   Pulse 84   Temp 98.7 F (37.1 C) (Oral)   Resp 18   Ht 5\' 3"  (1.6 m)   Wt 95.3 kg   LMP 12/08/2020   SpO2 100%   BMI 37.20 kg/m   Physical Exam Vitals and nursing note reviewed.  Constitutional:       General: She is not in acute distress.    Appearance: Normal appearance.     Comments: Resting in the bed in no acute distress  HENT:     Head: Normocephalic and atraumatic.  Eyes:     Conjunctiva/sclera: Conjunctivae normal.     Pupils: Pupils are equal, round, and reactive to light.  Cardiovascular:     Rate and Rhythm: Normal rate and regular rhythm.     Pulses: Normal pulses.  Pulmonary:     Effort: Pulmonary effort is normal. No respiratory distress.     Breath sounds: Normal breath sounds. No wheezing.     Comments: Speaking in full sentences.  Clear lung sounds in all fields. Chest:     Chest wall: Tenderness present.       Comments: Tenderness palpation of the anterior superior right chest wall Abdominal:     General: There is no distension.     Palpations: Abdomen is soft.     Tenderness: There is no abdominal tenderness.  Musculoskeletal:        General: Tenderness present. Normal range of motion.     Cervical back: Normal range of motion and neck supple.       Back:     Comments: Tenderness palpation of the right mid and upper back.  No chest palpation over midline spine.  No tenderness palpation of the right arm.  Radial pulses 2+ bilaterally.  Grip strength equal bilaterally.  Full active range of motion with pain.  With palpation of the trapezius, patient reports increased pain in her right arm.  Skin:    General: Skin is warm and dry.     Capillary Refill: Capillary refill takes less than 2 seconds.  Neurological:     Mental Status: She is alert and oriented to person, place, and time.  Psychiatric:        Mood and Affect: Mood and affect normal.        Speech: Speech normal.        Behavior: Behavior normal.       ED Results / Procedures / Treatments   Labs (all labs ordered are listed, but only abnormal results are displayed) Labs Reviewed  CBC    EKG EKG Interpretation  Date/Time:  Sunday December 09 2020 11:11:11  EDT Ventricular Rate:  75 PR  Interval:  176 QRS Duration: 74 QT Interval:  374 QTC Calculation: 417 R Axis:   27 Text Interpretation: Normal sinus rhythm Normal ECG normal axis No acute changes Confirmed by Pieter Partridge ([id number]) on 12/09/2020 11:19:47 AM  Radiology No results found.  Procedures Procedures   Medications Ordered in ED Medications  predniSONE (DELTASONE) tablet 60 mg (60 mg Oral Given 12/09/20 1152)    ED Course  I have reviewed the triage vital signs and the nursing notes.  Pertinent labs & imaging results that were available during my care of the patient were reviewed by me and considered in my medical decision making (see chart for details).    MDM Rules/Calculators/A&P                           Patient presenting for evaluation of right-sided back/shoulder/chest pain.  On exam, patient appears nontoxic.  She is neurovascular intact.  Pain is reproducible with palpation of the musculature.  Worse with movement.  She has no infectious symptoms, doubt pneumonia.  She has no PE risk factors and she is PERC negative, doubt PE.  In the setting of heavy lifting for her job, likely musculoskeletal.  Discussed with patient.  Discussed symptomatic management.  As patient does have pain that is now radiating into her arm, consider muscular pain that is causing inflammation/radiculopathy.  However no pain over midline C-spine and no neurologic deficits, I do not believe she needs emergent MRI.  We will treat for radiculopathy and muscle strain.  Strict return precautions given including signs of infection, PE, DVT, or neurologic compromise.  At this time, patient present for discharge.  Patient states she understands and agrees to plan  Unfortunately after patient was discharged/plan was discussed x-ray was performed.  Viewed and independently interpreted by me, no pneumothorax, pneumonia, fracture, dislocation or bony lesions.   Final Clinical Impression(s) / ED Diagnoses Final diagnoses:  Acute right-sided  thoracic back pain  Radiculopathy, unspecified spinal region    Rx / DC Orders ED Discharge Orders          Ordered    predniSONE (STERAPRED UNI-PAK 21 TAB) 10 MG (21) TBPK tablet  Daily        12/09/20 1133    methocarbamol (ROBAXIN) 500 MG tablet  2 times daily PRN,   Status:  Discontinued        12/09/20 1133    lidocaine (LIDODERM) 5 %  Every 24 hours        12/09/20 1133    methocarbamol (ROBAXIN) 500 MG tablet  2 times daily PRN        12/09/20 1138             Christiann Hagerty, PA-C 12/09/20 1256    Koleen Distance, MD 12/09/20 1511

## 2020-12-09 NOTE — ED Triage Notes (Signed)
Pt arrives with c/o pain in back starting on Tuesday, pt reports pain radiating down her back with some SOB and some CP.

## 2020-12-09 NOTE — Discharge Instructions (Addendum)
Take prednisone as prescribed.  Have caution if you decide to take other anti-inflammatories at the same time (Advil, Motrin, ibuprofen, Aleve) as it can cause stomach upset. You may supplement with Tylenol if you need further pain control. Use Robaxin as needed for muscle stiffness or soreness. Have caution, as this may make you tired or groggy. Do not drive or operate heavy machinery while taking this medication.  Use muscle creams (bengay, icy hot, salonpas) as needed for pain.  Return to the emergency room if you develop numbness of your hand, fever, cough, increasing pain, pain or swelling of your leg, or any new, worsening, or concerning symptoms

## 2021-05-21 ENCOUNTER — Other Ambulatory Visit: Payer: Self-pay

## 2021-05-21 ENCOUNTER — Emergency Department (HOSPITAL_BASED_OUTPATIENT_CLINIC_OR_DEPARTMENT_OTHER)
Admission: EM | Admit: 2021-05-21 | Discharge: 2021-05-21 | Disposition: A | Discharge status: Home / Self Care | Payer: BC Managed Care – PPO | Attending: Emergency Medicine | Admitting: Emergency Medicine

## 2021-05-21 ENCOUNTER — Encounter (HOSPITAL_BASED_OUTPATIENT_CLINIC_OR_DEPARTMENT_OTHER): Payer: Self-pay | Admitting: Emergency Medicine

## 2021-05-21 DIAGNOSIS — R059 Cough, unspecified: Secondary | ICD-10-CM | POA: Insufficient documentation

## 2021-05-21 DIAGNOSIS — R0602 Shortness of breath: Secondary | ICD-10-CM | POA: Insufficient documentation

## 2021-05-21 DIAGNOSIS — Z20822 Contact with and (suspected) exposure to covid-19: Secondary | ICD-10-CM

## 2021-05-21 DIAGNOSIS — R0981 Nasal congestion: Secondary | ICD-10-CM | POA: Insufficient documentation

## 2021-05-21 LAB — RESP PANEL BY RT-PCR (FLU A&B, COVID) ARPGX2
Influenza A by PCR: NEGATIVE
Influenza B by PCR: NEGATIVE
SARS Coronavirus 2 by RT PCR: NEGATIVE

## 2021-05-21 MED ORDER — BENZONATATE 100 MG PO CAPS: 100.0000 mg | ORAL_CAPSULE | Freq: Three times a day (TID) | ORAL | 0 refills | Status: DC

## 2021-05-21 MED ORDER — ONDANSETRON 4 MG PO TBDP: ORAL_TABLET | ORAL | 0 refills | Status: AC

## 2021-05-21 MED ORDER — PREDNISONE 50 MG PO TABS
60.0000 mg | ORAL_TABLET | Freq: Once | ORAL | Status: AC
  Administered 2021-05-21: 08:00:00 60 mg via ORAL
  Filled 2021-05-21: qty 1

## 2021-05-21 MED ORDER — ALBUTEROL SULFATE HFA 108 (90 BASE) MCG/ACT IN AERS
2.0000 | INHALATION_SPRAY | Freq: Once | RESPIRATORY_TRACT | Status: AC
  Administered 2021-05-21: 08:00:00 2 via RESPIRATORY_TRACT
  Filled 2021-05-21: qty 6.7

## 2021-05-21 MED ORDER — PREDNISONE 10 MG PO TABS: 40.0000 mg | ORAL_TABLET | Freq: Every day | ORAL | 0 refills | Status: AC

## 2021-05-21 NOTE — ED Provider Notes (Addendum)
[facility] EMERGENCY DEPARTMENT Provider Note   CSN: [id number] Arrival date & time: 05/21/21  0725     History  Chief Complaint  Patient presents with   Shortness of Breath    Monica Gamble is a 36 y.o. female.  36 yo F with a chief complaints of shortness of breath cough congestion.  Eating and drinking a little bit less.  Her son has COVID and she has been in close contact with him.  She has been using her inhaler but is almost out.  She tells me that she does not have a history of asthma but when she has an upper respiratory syndrome typically benefits from albuterol.  The history is provided by the patient.  Shortness of Breath Severity:  Moderate Onset quality:  Gradual Duration:  2 days Timing:  Constant Progression:  Worsening Chronicity:  New Relieved by:  Nothing Worsened by:  Nothing     Home Medications Prior to Admission medications   Medication Sig Start Date End Date Taking? Authorizing Provider  benzonatate (TESSALON) 100 MG capsule Take 1 capsule (100 mg total) by mouth every 8 (eight) hours. 05/21/21  Yes Melene PlanFloyd, Levette Paulick, DO  ondansetron (ZOFRAN-ODT) 4 MG disintegrating tablet 4mg  ODT q4 hours prn nausea/vomit 05/21/21  Yes Melene PlanFloyd, Jovon Streetman, DO  predniSONE (DELTASONE) 10 MG tablet Take 4 tablets (40 mg total) by mouth daily for 4 days. 05/21/21 05/25/21 Yes Melene PlanFloyd, Burnadette Baskett, DO  Acidophilus Lactobacillus CAPS Take 1 tablet by mouth daily as needed. 12/22/17   Calvert CantorWeinhold, Samantha C, CNM  clonazePAM (KLONOPIN) 0.5 MG tablet  06/24/18   [provider]  lidocaine (LIDODERM) 5 % Place 1 patch onto the skin daily. Remove & Discard patch within 12 hours or as directed by MD 12/09/20   Caccavale, Sophia, PA-C  methocarbamol (ROBAXIN) 500 MG tablet Take 1 tablet (500 mg total) by mouth 2 (two) times daily as needed for muscle spasms. 12/09/20   Caccavale, Sophia, PA-C  metroNIDAZOLE (FLAGYL) 500 MG tablet Take 1 tablet (500 mg total) by mouth 2 (two) times daily.  12/22/17   Calvert CantorWeinhold, Samantha C, CNM  Prenatal Multivit-Min-Fe-FA (PRENATAL VITAMINS PO) Take 1 tablet by mouth daily.     [provider]  sertraline (ZOLOFT) 50 MG tablet TK 1/2 T PO QD FOR 6 DAYS THEN TK 1 T PO D 06/24/18   [provider]      Allergies    Bactrim [sulfamethoxazole-trimethoprim] and Doxycycline    Review of Systems   Review of Systems  Respiratory:  Positive for shortness of breath.    Physical Exam Updated Vital Signs BP 121/84    Pulse 68    Temp 98.1 F (36.7 C) (Oral)    Resp (!) 24    Ht 5\' 3"  (1.6 m)    Wt 95.3 kg    LMP 05/13/2021    SpO2 99%    BMI 37.20 kg/m  Physical Exam Vitals and nursing note reviewed.  Constitutional:      General: She is not in acute distress.    Appearance: She is well-developed. She is not diaphoretic.  HENT:     Head: Normocephalic and atraumatic.  Eyes:     Pupils: Pupils are equal, round, and reactive to light.  Cardiovascular:     Rate and Rhythm: Normal rate and regular rhythm.     Heart sounds: No murmur heard.   No friction rub. No gallop.  Pulmonary:     Effort: Pulmonary effort is normal.  Breath sounds: No wheezing, rhonchi or rales.     Comments: Bronchospastic cough Abdominal:     General: There is no distension.     Palpations: Abdomen is soft.     Tenderness: There is no abdominal tenderness.  Musculoskeletal:        General: No tenderness.     Cervical back: Normal range of motion and neck supple.  Skin:    General: Skin is warm and dry.  Neurological:     Mental Status: She is alert and oriented to person, place, and time.  Psychiatric:        Behavior: Behavior normal.    ED Results / Procedures / Treatments   Labs (all labs ordered are listed, but only abnormal results are displayed) Labs Reviewed  RESP PANEL BY RT-PCR (FLU A&B, COVID) [id number]    EKG EKG Interpretation  Date/Time:  Tuesday May 21 2021 07:39:50 EST Ventricular Rate:  66 PR Interval:  181 QRS  Duration: 92 QT Interval:  383 QTC Calculation: 402 R Axis:   40 Text Interpretation: Sinus rhythm No significant change since last tracing Confirmed by Melene Plan [phone]) on 05/21/2021 7:51:42 AM  Radiology No results found.  Procedures Procedures    Medications Ordered in ED Medications  predniSONE (DELTASONE) tablet 60 mg (60 mg Oral Given 05/21/21 0802)  albuterol (VENTOLIN HFA) 108 (90 Base) MCG/ACT inhaler 2 puff (2 puffs Inhalation Given 05/21/21 0756)    ED Course/ Medical Decision Making/ A&P                           Medical Decision Making Risk Prescription drug management.   36 yo F with a chief complaints of shortness of breath cough congestion going on for couple days.  Most likely the patient has COVID as she has been in close contact with her son who has it.  She is well-appearing and nontoxic.  100% on room air.  She has clear lung sounds for me very mild prolonged expiratory effort.  No wheezes.  I do not feel she would benefit from chest x-ray as I feel her symptoms are most likely viral.  She has some mild decreased oral intake but I do not feel laboratory evaluation would benefit her either.  We will treat supportively.  I do feel she qualifies for antiviral therapy with COVID.  Cough and nausea medicine as needed.  With her history of wheezing associated with upper respiratory illness and mild prolonged expiratory efforts will do burst dose steroids.  She unfortunately has run out of her inhaler and I am not sure that she would be able to afford one at the pharmacy we will give her an inhaler here to take home.  PCP follow-up.  8:06 AM:  I have discussed the diagnosis/risks/treatment options with the patient and believe the pt to be eligible for discharge home to follow-up with PCP. We also discussed returning to the ED immediately if new or worsening sx occur. We discussed the sx which are most concerning (e.g., sudden worsening pain, fever, inability to tolerate by  mouth) that necessitate immediate return. Medications administered to the patient during their visit and any new prescriptions provided to the patient are listed below.  Medications given during this visit Medications  predniSONE (DELTASONE) tablet 60 mg (60 mg Oral Given 05/21/21 0802)  albuterol (VENTOLIN HFA) 108 (90 Base) MCG/ACT inhaler 2 puff (2 puffs Inhalation Given 05/21/21 0756)     The patient  appears reasonably screen and/or stabilized for discharge and I doubt any other medical condition or other [facility] requiring further screening, evaluation, or treatment in the ED at this time prior to discharge.          Final Clinical Impression(s) / ED Diagnoses Final diagnoses:  Suspected COVID-19 virus infection    Rx / DC Orders ED Discharge Orders          Ordered    predniSONE (DELTASONE) 10 MG tablet  Daily        05/21/21 0749    benzonatate (TESSALON) 100 MG capsule  Every 8 hours        05/21/21 0749    ondansetron (ZOFRAN-ODT) 4 MG disintegrating tablet        05/21/21 0749              Melene Plan, DO 05/21/21 0806    Melene Plan, DO 05/21/21 5993    Melene Plan, DO 05/21/21 [phone]

## 2021-05-21 NOTE — ED Triage Notes (Signed)
Pt arrives pov with c/o cough, shob and diarrhea. Pt denies fever

## 2021-05-21 NOTE — Discharge Instructions (Signed)
Take tylenol 2 pills 4 times a day and motrin 4 pills 3 times a day.  Drink plenty of fluids.  Return for worsening shortness of breath, headache, confusion. Follow up with your family doctor.   

## 2021-11-13 ENCOUNTER — Emergency Department (HOSPITAL_COMMUNITY): Payer: Self-pay

## 2021-11-13 ENCOUNTER — Emergency Department (HOSPITAL_COMMUNITY)
Admission: EM | Admit: 2021-11-13 | Discharge: 2021-11-13 | Disposition: A | Discharge status: Home / Self Care | Payer: Self-pay | Attending: Emergency Medicine | Admitting: Emergency Medicine

## 2021-11-13 ENCOUNTER — Other Ambulatory Visit: Payer: Self-pay

## 2021-11-13 ENCOUNTER — Encounter (HOSPITAL_COMMUNITY): Payer: Self-pay

## 2021-11-13 DIAGNOSIS — M25511 Pain in right shoulder: Secondary | ICD-10-CM | POA: Insufficient documentation

## 2021-11-13 DIAGNOSIS — R0789 Other chest pain: Secondary | ICD-10-CM | POA: Insufficient documentation

## 2021-11-13 DIAGNOSIS — R079 Chest pain, unspecified: Secondary | ICD-10-CM

## 2021-11-13 LAB — BASIC METABOLIC PANEL
Anion gap: 4 — ABNORMAL LOW (ref 5–15)
BUN: 8 mg/dL (ref 6–20)
CO2: 25 mmol/L (ref 22–32)
Calcium: 9.1 mg/dL (ref 8.9–10.3)
Chloride: 109 mmol/L (ref 98–111)
Creatinine, Ser: 0.84 mg/dL (ref 0.44–1.00)
GFR, Estimated: >60 mL/min (ref >60)
Glucose, Bld: 98 mg/dL (ref 70–99)
Potassium: 3.7 mmol/L (ref 3.5–5.1)
Sodium: 138 mmol/L (ref 135–145)

## 2021-11-13 LAB — CBC
HCT: 35 % — ABNORMAL LOW (ref 36.0–46.0)
Hemoglobin: 10.8 g/dL — ABNORMAL LOW (ref 12.0–15.0)
MCH: 22.6 pg — ABNORMAL LOW (ref 26.0–34.0)
MCHC: 30.9 g/dL (ref 30.0–36.0)
MCV: 73.2 fL — ABNORMAL LOW (ref 80.0–100.0)
Platelets: 383 10*3/uL (ref 150–400)
RBC: 4.78 MIL/uL (ref 3.87–5.11)
RDW: 17.2 % — ABNORMAL HIGH (ref 11.5–15.5)
WBC: 8.5 10*3/uL (ref 4.0–10.5)
nRBC: 0 % (ref 0.0–0.2)

## 2021-11-13 LAB — TROPONIN I (HIGH SENSITIVITY): Troponin I (High Sensitivity): <2 ng/L (ref <18)

## 2021-11-13 MED ORDER — ACETAMINOPHEN 325 MG PO TABS
650.0000 mg | ORAL_TABLET | Freq: Once | ORAL | Status: AC
Start: 2021-11-13 — End: 2021-11-13
  Administered 2021-11-13: 650 mg via ORAL
  Filled 2021-11-13: qty 2

## 2021-11-13 NOTE — ED Provider Notes (Signed)
[facility]   CSN: [id number] Arrival date & time: 11/13/21  1536     History  Chief Complaint  Patient presents with   Chest Pain    Monica Gamble is a 36 y.o. female.  Patient presents chief complaint of chest pain describes his right shoulder pain rating to the chest.  She has been having it for 2 days now.  She says she noticed that at work yesterday.  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  Nothing seems to make it worse other than moving her right shoulder a certain way.  Otherwise denies any difficulty breathing denies chest pain at rest.  Denies fevers or cough or vomiting or diarrhea.  Symptoms ongoing for 2 days so she presents to the ER for evaluation.       Home Medications Prior to Admission medications   Medication Sig Start Date End Date Taking? Authorizing Provider  Acidophilus Lactobacillus CAPS Take 1 tablet by mouth daily as needed. 12/22/17   Calvert Cantor, CNM  benzonatate (TESSALON) 100 MG capsule Take 1 capsule (100 mg total) by mouth every 8 (eight) hours. 05/21/21   Melene Plan, DO  clonazePAM (KLONOPIN) 0.5 MG tablet  06/24/18   [provider]  lidocaine (LIDODERM) 5 % Place 1 patch onto the skin daily. Remove & Discard patch within 12 hours or as directed by MD 12/09/20   Caccavale, Sophia, PA-C  methocarbamol (ROBAXIN) 500 MG tablet Take 1 tablet (500 mg total) by mouth 2 (two) times daily as needed for muscle spasms. 12/09/20   Caccavale, Sophia, PA-C  metroNIDAZOLE (FLAGYL) 500 MG tablet Take 1 tablet (500 mg total) by mouth 2 (two) times daily. 12/22/17   Calvert Cantor, CNM  ondansetron (ZOFRAN-ODT) 4 MG disintegrating tablet 4mg  ODT q4 hours prn nausea/vomit 05/21/21   05/23/21, DO  Prenatal Multivit-Min-Fe-FA (PRENATAL VITAMINS PO) Take 1 tablet by mouth daily.     [provider]  sertraline (ZOLOFT) 50 MG tablet TK 1/2 T PO QD FOR 6 DAYS THEN TK 1 T  PO D 06/24/18   [provider]      Allergies    Bactrim [sulfamethoxazole-trimethoprim] and Doxycycline    Review of Systems   Review of Systems  Constitutional:  Negative for fever.  HENT:  Negative for ear pain.   Eyes:  Negative for pain.  Respiratory:  Negative for cough.   Cardiovascular:  Positive for chest pain.  Gastrointestinal:  Negative for abdominal pain.  Genitourinary:  Negative for flank pain.  Musculoskeletal:  Negative for back pain.  Skin:  Negative for rash.  Neurological:  Negative for headaches.    Physical Exam Updated Vital Signs BP 126/75   Pulse 81   Temp 98 F (36.7 C)   Resp 17   Ht 5\' 3"  (1.6 m)   Wt 95.3 kg   LMP 11/06/2021 (Approximate)   SpO2 97%   BMI 37.20 kg/m  Physical Exam Constitutional:      General: She is not in acute distress.    Appearance: Normal appearance.  HENT:     Head: Normocephalic.     Nose: Nose normal.  Eyes:     Extraocular Movements: Extraocular movements intact.  Cardiovascular:     Rate and Rhythm: Normal rate.  Pulmonary:     Effort: Pulmonary effort is normal.  Musculoskeletal:        General: Normal range of motion.  Cervical back: Normal range of motion.     Comments: Normal range of motion of the right shoulder left shoulder without pain or discomfort.  Neurovascular intact bilateral upper extremities compartments are soft.  Mild tenderness palpation of the right chest wall.    Neurological:     General: No focal deficit present.     Mental Status: She is alert and oriented to person, place, and time. Mental status is at baseline.     Cranial Nerves: No cranial nerve deficit.     Motor: No weakness.     ED Results / Procedures / Treatments   Labs (all labs ordered are listed, but only abnormal results are displayed) Labs Reviewed  BASIC METABOLIC PANEL - Abnormal; Notable for the following components:      Result Value   Anion gap 4 (*)    All other components within normal  limits  CBC - Abnormal; Notable for the following components:   Hemoglobin 10.8 (*)    HCT 35.0 (*)    MCV 73.2 (*)    MCH 22.6 (*)    RDW 17.2 (*)    All other components within normal limits  POC URINE PREG, ED  TROPONIN I (HIGH SENSITIVITY)    EKG None  Radiology DG Chest 2 View  Result Date: 11/13/2021 CLINICAL DATA:  36 year old female with right-sided chest pain radiating to the right neck. EXAM: CHEST - 2 VIEW COMPARISON:  Radiographs 12/09/2020 FINDINGS: No focal consolidation, pleural effusion, or pneumothorax. Normal cardiomediastinal silhouette. No displaced rib fractures. IMPRESSION: Normal chest. Electronically Signed   By: Minerva Fester M.D.   On: 11/13/2021 16:17    Procedures Procedures    Medications Ordered in ED Medications  acetaminophen (TYLENOL) tablet 650 mg (650 mg Oral Given 11/13/21 1921)    ED Course/ Medical Decision Making/ A&P                           Medical Decision Making Risk OTC drugs.   Review of systems shows visit May 21, 2021 for COVID infection.  Work-up included labs chemistry unremarkable CBC shows hemoglobin 10.8 troponin undetectable.  EKG shows sinus rhythm no ST elevation depressions no T wave inversions heart rate of 72 bpm QTc 417.  Patient presentation is very atypical.  She has ongoing chest pain reproducible with palpation and with range of motion of the right shoulder.  Troponin is negative.  Will recommend outpatient follow-up with her doctor this week.  Advised Tylenol Motrin at home as needed.  Advised immediate return for worsening symptoms or any additional concerns.        Final Clinical Impression(s) / ED Diagnoses Final diagnoses:  Nonspecific chest pain    Rx / DC Orders ED Discharge Orders     None         [name], MD 11/13/21 [phone]

## 2021-11-13 NOTE — ED Triage Notes (Addendum)
Pt c/o right sided chest pain that radiates to right side of neck since yesterday. Pt states pain worsens when she touches her chest or moves her neck. Denies sob or any other associated symptoms. Unsure if she could have pulled a muscle at work

## 2021-11-13 NOTE — ED Notes (Signed)
Pt A&OX4 ambulatory at d/c with independent steady gait. Pt verbalized understanding of d/c instructions and follow up care. 

## 2021-11-13 NOTE — ED Notes (Signed)
Dr. Audley Hose states he does not need the second troponin

## 2021-11-13 NOTE — ED Notes (Signed)
Pt states that she does not want the electrodes on her as she reacts to them. Pt refuses cardiac monitoring at this time.

## 2021-11-13 NOTE — Discharge Instructions (Addendum)
Call your primary care doctor or specialist as discussed in the next 2-3 days.   Return immediately back to the ER if:  Your symptoms worsen within the next 12-24 hours. You develop new symptoms such as new fevers, persistent vomiting, new pain, shortness of breath, or new weakness or numbness, or if you have any other concerns.  

## 2021-11-13 NOTE — ED Provider Triage Note (Signed)
Emergency Medicine Provider Triage Evaluation Note  Monica Gamble , a 36 y.o. female  was evaluated in triage.  Pt complains of CP. Gradual onset of pain to R side of chest radiates to R side of neck since yesterday, worsen with movement or palpation.  Cant' sleep.  No headache, lightheadedness, sob, n/v/d, numbness or weakness.  Does do heavy lifting at work but never had this pain before.  No cardiac hx  Review of Systems  Positive: As above Negative: As above  Physical Exam  BP 130/84   Pulse 72   Temp 98.6 F (37 C) (Oral)   Resp 17   Ht 5\' 3"  (1.6 m)   Wt 95.3 kg   SpO2 100%   BMI 37.20 kg/m  Gen:   Awake, no distress   Resp:  Normal effort  MSK:   Moves extremities without difficulty  Other:    Medical Decision Making  Medically screening exam initiated at 3:51 PM.  Appropriate orders placed.  Monica Gamble was informed that the remainder of the evaluation will be completed by another provider, this initial triage assessment does not replace that evaluation, and the importance of remaining in the ED until their evaluation is complete.  Suspect MSK, Doubt dissection   Debbe Bales, PA-C 11/13/21 1556

## 2022-03-19 ENCOUNTER — Other Ambulatory Visit: Payer: Self-pay

## 2022-03-19 ENCOUNTER — Emergency Department (HOSPITAL_COMMUNITY)
Admission: EM | Admit: 2022-03-19 | Discharge: 2022-03-19 | Disposition: A | Discharge status: Home / Self Care | Payer: Self-pay | Attending: Nurse Practitioner | Admitting: Nurse Practitioner

## 2022-03-19 ENCOUNTER — Encounter (HOSPITAL_COMMUNITY): Payer: Self-pay

## 2022-03-19 DIAGNOSIS — K0889 Other specified disorders of teeth and supporting structures: Secondary | ICD-10-CM | POA: Insufficient documentation

## 2022-03-19 MED ORDER — NAPROXEN 375 MG PO TABS: 375.0000 mg | ORAL_TABLET | Freq: Two times a day (BID) | ORAL | 0 refills | Status: AC

## 2022-03-19 MED ORDER — AMOXICILLIN 500 MG PO CAPS: 500.0000 mg | ORAL_CAPSULE | Freq: Three times a day (TID) | ORAL | 0 refills | Status: DC

## 2022-03-19 NOTE — ED Triage Notes (Addendum)
Pt reports with right upper dental pain x 2 days. Pt reports having issues with a few teeth. Pt has swelling to the right side of her face.

## 2022-03-19 NOTE — Discharge Instructions (Addendum)
Please refer to the attached instructions. You may treat your pain with tylenol and naproxen as discussed. Follow-up with your new dentist as scheduled, or you may check with the dentist listed above to see if you can be seen sooner.

## 2022-03-19 NOTE — ED Provider Notes (Signed)
[facility]ERGENCY [facility]PT Provider Note   CSN: [id number] Arrival date & time: 03/19/22  2037     History  Chief Complaint  Patient presents with   Dental Pain    Monica Gamble is a 36 y.o. female.  Patient presents with right upper dental pain onset two days ago. Patient has a decayed and broken tooth at the area of pain. Patient has recently obtained dental insurance, but is not scheduled for an appointment until mid December.  The history is provided by the patient. No language interpreter was used.  Dental Pain Location:  Upper Upper teeth location:  4/RU 2nd bicuspid Quality:  Localized, aching and throbbing Severity:  Moderate Onset quality:  Gradual Duration:  2 days Timing:  Constant Progression:  Worsening Context: dental caries   Ineffective treatments:  NSAIDs Associated symptoms: gum swelling   Associated symptoms: no difficulty swallowing, no fever, no neck pain, no oral lesions and no trismus        Home Medications Prior to Admission medications   Medication Sig Start Date End Date Taking? Authorizing Provider  Acidophilus Lactobacillus CAPS Take 1 tablet by mouth daily as needed. 12/22/17   Calvert Cantor, CNM  benzonatate (TESSALON) 100 MG capsule Take 1 capsule (100 mg total) by mouth every 8 (eight) hours. 05/21/21   Melene Plan, DO  clonazePAM (KLONOPIN) 0.5 MG tablet  06/24/18   [provider]  lidocaine (LIDODERM) 5 % Place 1 patch onto the skin daily. Remove & Discard patch within 12 hours or as directed by MD 12/09/20   Caccavale, Sophia, PA-C  methocarbamol (ROBAXIN) 500 MG tablet Take 1 tablet (500 mg total) by mouth 2 (two) times daily as needed for muscle spasms. 12/09/20   Caccavale, Sophia, PA-C  metroNIDAZOLE (FLAGYL) 500 MG tablet Take 1 tablet (500 mg total) by mouth 2 (two) times daily. 12/22/17   Calvert Cantor, CNM  ondansetron (ZOFRAN-ODT) 4 MG disintegrating tablet 4mg  ODT q4 hours prn  nausea/vomit 05/21/21   05/23/21, DO  Prenatal Multivit-Min-Fe-FA (PRENATAL VITAMINS PO) Take 1 tablet by mouth daily.     [provider]  sertraline (ZOLOFT) 50 MG tablet TK 1/2 T PO QD FOR 6 DAYS THEN TK 1 T PO D 06/24/18   [provider]      Allergies    Bactrim [sulfamethoxazole-trimethoprim] and Doxycycline    Review of Systems   Review of Systems  Constitutional:  Negative for fever.  HENT:  Negative for mouth sores.   Musculoskeletal:  Negative for neck pain.  All other systems reviewed and are negative.   Physical Exam Updated Vital Signs BP (!) 145/84   Pulse 75   Temp 98.8 F (37.1 C) (Oral)   Resp 14   Ht 5\' 3"  (1.6 m)   Wt 95.3 kg   SpO2 100%   BMI 37.20 kg/m  Physical Exam Vitals and nursing note reviewed.  HENT:     Head: Normocephalic.     Nose: Nose normal.     Mouth/Throat:     Mouth: Mucous membranes are moist.  Eyes:     Conjunctiva/sclera: Conjunctivae normal.  Cardiovascular:     Rate and Rhythm: Normal rate.  Pulmonary:     Effort: Pulmonary effort is normal.  Musculoskeletal:     Cervical back: Normal range of motion.  Lymphadenopathy:     Cervical: No cervical adenopathy.  Skin:    General: Skin is warm and dry.  Neurological:  Mental Status: She is oriented to person, place, and time.  Psychiatric:        Mood and Affect: Mood normal.        Behavior: Behavior normal.     ED Results / Procedures / Treatments   Labs (all labs ordered are listed, but only abnormal results are displayed) Labs Reviewed - No data to display  EKG None  Radiology No results found.  Procedures Procedures    Medications Ordered in ED Medications - No data to display  ED Course/ Medical Decision Making/ A&P                           Medical Decision Making  Patient with dentalgia.  No abscess requiring immediate incision and drainage.  Exam not concerning for Ludwig's angina or pharyngeal abscess.  Will treat with  amoxicillin and naproxen. Pt instructed to follow-up with dentist.  Discussed return precautions. Pt safe for discharge.         Final Clinical Impression(s) / ED Diagnoses Final diagnoses:  Pain, dental    Rx / DC Orders ED Discharge Orders          Ordered    amoxicillin (AMOXIL) 500 MG capsule  3 times daily        03/19/22 2105    naproxen (NAPROSYN) 375 MG tablet  2 times daily        03/19/22 2105              Felicie Morn, NP 03/19/22 2200    Linwood Dibbles, MD 03/22/22 2054

## 2022-08-11 ENCOUNTER — Encounter: Payer: Self-pay | Admitting: *Deleted

## 2023-06-08 ENCOUNTER — Emergency Department (HOSPITAL_COMMUNITY)
Admission: EM | Admit: 2023-06-08 | Discharge: 2023-06-08 | Disposition: A | Discharge status: Home / Self Care | Payer: BC Managed Care – PPO | Attending: Emergency Medicine | Admitting: Emergency Medicine

## 2023-06-08 ENCOUNTER — Encounter (HOSPITAL_COMMUNITY): Payer: Self-pay

## 2023-06-08 DIAGNOSIS — Z20822 Contact with and (suspected) exposure to covid-19: Secondary | ICD-10-CM | POA: Insufficient documentation

## 2023-06-08 DIAGNOSIS — J45909 Unspecified asthma, uncomplicated: Secondary | ICD-10-CM | POA: Diagnosis not present

## 2023-06-08 DIAGNOSIS — J069 Acute upper respiratory infection, unspecified: Secondary | ICD-10-CM | POA: Diagnosis not present

## 2023-06-08 DIAGNOSIS — R059 Cough, unspecified: Secondary | ICD-10-CM | POA: Diagnosis present

## 2023-06-08 LAB — RESP PANEL BY RT-PCR (RSV, FLU A&B, COVID)  RVPGX2
Influenza A by PCR: NEGATIVE
Influenza B by PCR: NEGATIVE
Resp Syncytial Virus by PCR: NEGATIVE
SARS Coronavirus 2 by RT PCR: NEGATIVE

## 2023-06-08 LAB — GROUP A STREP BY PCR: Group A Strep by PCR: NOT DETECTED

## 2023-06-08 MED ORDER — DEXAMETHASONE 4 MG PO TABS
10.0000 mg | ORAL_TABLET | Freq: Once | ORAL | Status: AC
  Administered 2023-06-08: 10 mg via ORAL
  Filled 2023-06-08: qty 1

## 2023-06-08 NOTE — Discharge Instructions (Addendum)
Drink plenty of fluids.  Take acetaminophen and/or ibuprofen as needed for fever or aching. 

## 2023-06-08 NOTE — ED Provider Notes (Signed)
 [facility] AT [facility] Provider Note   CSN: [id number] Arrival date & time: 06/08/23  4098     History  Chief Complaint  Patient presents with   Sore Throat    Monica Gamble is a 38 y.o. female.  The history is provided by the patient.  Sore Throat  She has history of asthma and comes in because of cough and sore throat for the last 36 hours.  Cough is productive of small amount of yellow sputum.  She denies fever, chills, sweats.  She denies vomiting or diarrhea.  She has had a sick contact who had influenza.   Home Medications Prior to Admission medications   Medication Sig Start Date End Date Taking? Authorizing Provider  Acidophilus Lactobacillus CAPS Take 1 tablet by mouth daily as needed. 12/22/17   Weinhold, Samantha C, CNM  amoxicillin  (AMOXIL ) 500 MG capsule Take 1 capsule (500 mg total) by mouth 3 (three) times daily. 03/19/22   Gigi Kyle, NP  benzonatate  (TESSALON ) 100 MG capsule Take 1 capsule (100 mg total) by mouth every 8 (eight) hours. 05/21/21   Floyd, Dan, DO  clonazePAM (KLONOPIN) 0.5 MG tablet  06/24/18   [provider]  lidocaine  (LIDODERM ) 5 % Place 1 patch onto the skin daily. Remove & Discard patch within 12 hours or as directed by MD 12/09/20   Caccavale, Sophia, PA-C  methocarbamol  (ROBAXIN ) 500 MG tablet Take 1 tablet (500 mg total) by mouth 2 (two) times daily as needed for muscle spasms. 12/09/20   Caccavale, Sophia, PA-C  metroNIDAZOLE  (FLAGYL ) 500 MG tablet Take 1 tablet (500 mg total) by mouth 2 (two) times daily. 12/22/17   Weinhold, Samantha C, CNM  naproxen  (NAPROSYN ) 375 MG tablet Take 1 tablet (375 mg total) by mouth 2 (two) times daily. 03/19/22   Gigi Kyle, NP  ondansetron  (ZOFRAN -ODT) 4 MG disintegrating tablet 4mg  ODT q4 hours prn nausea/vomit 05/21/21   Floyd, Dan, DO  Prenatal Multivit-Min-Fe-FA (PRENATAL VITAMINS PO) Take 1 tablet by mouth daily.     [provider]  sertraline  (ZOLOFT) 50 MG tablet TK 1/2 T PO QD FOR 6 DAYS THEN TK 1 T PO D 06/24/18   [provider]      Allergies    Bactrim [sulfamethoxazole-trimethoprim] and Doxycycline     Review of Systems   Review of Systems  All other systems reviewed and are negative.   Physical Exam Updated Vital Signs BP (!) 147/86 (BP Location: Left Arm)   Pulse 98   Temp 98.8 F (37.1 C) (Oral)   Resp 16   LMP 05/20/2023 (Approximate)   SpO2 98%  Physical Exam Vitals and nursing note reviewed.   38 year old female, resting comfortably and in no acute distress. Vital signs are significant for elevated systolic blood pressure. Oxygen saturation is 98%, which is normal. Head is normocephalic and atraumatic. PERRLA, EOMI. Oropharynx is clear. Neck is nontender and supple without adenopathy. Lungs are clear without rales, wheezes, or rhonchi. Chest is nontender. Heart has regular rate and rhythm without murmur. Abdomen is soft, flat, nontender. Extremities have no cyanosis or edema, full range of motion is present. Skin is warm and dry without rash. Neurologic: Mental status is normal, cranial nerves are intact, moves all extremities equally.  ED Results / Procedures / Treatments   Labs (all labs ordered are listed, but only abnormal results are displayed) Labs Reviewed  GROUP A STREP BY PCR  RESP PANEL BY RT-PCR (RSV, FLU  A&B, COVID)  [id number]   Procedures Procedures    Medications Ordered in ED Medications  dexamethasone  (DECADRON ) tablet 10 mg (has no administration in time range)    ED Course/ Medical Decision Making/ A&P                                 Medical Decision Making  Influenza-like illness in patient who had exposure to influenza.  Consider streptococcal disease.  Also consider other viral illnesses such as RSV, COVID-19.  I have ordered PCR test for strep, respiratory pathogens.  I have reviewed her laboratory test, my interpretation is negative PCR for strep, influenza,  COVID-19, RSV.  Patient has a different viral infection.  I have ordered a dose of dexamethasone  and I am instructing her to continue symptomatic treatment with fluids, NSAIDs, acetaminophen .  Final Clinical Impression(s) / ED Diagnoses Final diagnoses:  Viral URI with cough    Rx / DC Orders ED Discharge Orders     None         Alissa April, MD 06/08/23 [phone]

## 2023-06-08 NOTE — ED Triage Notes (Signed)
 Patient arrived with complaints of coughing and feeling like her throat is closing and painful to swallow over the last two days. Known sick contact.

## 2023-09-16 ENCOUNTER — Emergency Department (HOSPITAL_COMMUNITY)

## 2023-09-16 ENCOUNTER — Emergency Department (HOSPITAL_COMMUNITY)
Admission: EM | Admit: 2023-09-16 | Discharge: 2023-09-16 | Disposition: A | Discharge status: Home / Self Care | Attending: Emergency Medicine | Admitting: Emergency Medicine

## 2023-09-16 ENCOUNTER — Encounter (HOSPITAL_COMMUNITY): Payer: Self-pay

## 2023-09-16 ENCOUNTER — Other Ambulatory Visit: Payer: Self-pay

## 2023-09-16 DIAGNOSIS — J45909 Unspecified asthma, uncomplicated: Secondary | ICD-10-CM | POA: Insufficient documentation

## 2023-09-16 DIAGNOSIS — Y92410 Unspecified street and highway as the place of occurrence of the external cause: Secondary | ICD-10-CM | POA: Insufficient documentation

## 2023-09-16 DIAGNOSIS — M542 Cervicalgia: Secondary | ICD-10-CM | POA: Diagnosis present

## 2023-09-16 DIAGNOSIS — S161XXA Strain of muscle, fascia and tendon at neck level, initial encounter: Secondary | ICD-10-CM | POA: Insufficient documentation

## 2023-09-16 DIAGNOSIS — S39012A Strain of muscle, fascia and tendon of lower back, initial encounter: Secondary | ICD-10-CM | POA: Insufficient documentation

## 2023-09-16 LAB — POC URINE PREG, ED: Preg Test, Ur: NEGATIVE

## 2023-09-16 MED ORDER — IBUPROFEN 800 MG PO TABS
800.0000 mg | ORAL_TABLET | Freq: Once | ORAL | Status: AC
  Administered 2023-09-16: 800 mg via ORAL
  Filled 2023-09-16: qty 1

## 2023-09-16 MED ORDER — METHOCARBAMOL 500 MG PO TABS: 500.0000 mg | ORAL_TABLET | Freq: Three times a day (TID) | ORAL | 0 refills | Status: AC | PRN

## 2023-09-16 MED ORDER — ACETAMINOPHEN 500 MG PO TABS
1000.0000 mg | ORAL_TABLET | Freq: Once | ORAL | Status: AC
Start: 2023-09-16 — End: 2023-09-16
  Administered 2023-09-16: 1000 mg via ORAL
  Filled 2023-09-16: qty 2

## 2023-09-16 MED ORDER — DICLOFENAC SODIUM 1 % EX GEL: 2.0000 g | Freq: Four times a day (QID) | CUTANEOUS | 0 refills | Status: AC
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
## 2023-09-16 NOTE — Discharge Instructions (Signed)
